# Patient Record
Sex: Female | Born: 1965 | Race: Black or African American | Hispanic: No | Smoking: Never smoker
Health system: Southern US, Community
[De-identification: ages and names within clinical notes are randomized; demographics above are authoritative.]

## PROBLEM LIST (undated history)

## (undated) DIAGNOSIS — F329 Major depressive disorder, single episode, unspecified: Secondary | ICD-10-CM

## (undated) DIAGNOSIS — F419 Anxiety disorder, unspecified: Secondary | ICD-10-CM

## (undated) DIAGNOSIS — F32A Depression, unspecified: Secondary | ICD-10-CM

## (undated) HISTORY — PX: BREAST BIOPSY: SHX20

## (undated) HISTORY — PX: ABDOMINAL HYSTERECTOMY: SHX81

---

## 1998-12-08 ENCOUNTER — Other Ambulatory Visit: Admission: RE | Admit: 1998-12-08 | Discharge: 1998-12-08 | Payer: Self-pay | Admitting: Obstetrics & Gynecology

## 1999-06-16 ENCOUNTER — Inpatient Hospital Stay (HOSPITAL_COMMUNITY): Admission: AD | Admit: 1999-06-16 | Discharge: 1999-06-18 | Payer: Self-pay | Admitting: Obstetrics and Gynecology

## 1999-06-16 ENCOUNTER — Encounter (INDEPENDENT_AMBULATORY_CARE_PROVIDER_SITE_OTHER): Payer: Self-pay | Admitting: *Deleted

## 1999-11-12 ENCOUNTER — Other Ambulatory Visit: Admission: RE | Admit: 1999-11-12 | Discharge: 1999-11-12 | Payer: Self-pay | Admitting: Obstetrics and Gynecology

## 2002-04-21 ENCOUNTER — Other Ambulatory Visit: Admission: RE | Admit: 2002-04-21 | Discharge: 2002-04-21 | Payer: Self-pay | Admitting: Obstetrics and Gynecology

## 2002-06-11 ENCOUNTER — Encounter: Payer: Self-pay | Admitting: Family Medicine

## 2002-06-11 ENCOUNTER — Ambulatory Visit (HOSPITAL_COMMUNITY): Admission: RE | Admit: 2002-06-11 | Discharge: 2002-06-11 | Payer: Self-pay | Admitting: Family Medicine

## 2002-11-25 ENCOUNTER — Ambulatory Visit (HOSPITAL_COMMUNITY): Admission: RE | Admit: 2002-11-25 | Discharge: 2002-11-25 | Payer: Self-pay | Admitting: Obstetrics and Gynecology

## 2002-11-25 ENCOUNTER — Encounter: Payer: Self-pay | Admitting: Obstetrics and Gynecology

## 2003-05-11 ENCOUNTER — Other Ambulatory Visit: Admission: RE | Admit: 2003-05-11 | Discharge: 2003-05-11 | Payer: Self-pay | Admitting: Obstetrics and Gynecology

## 2005-01-15 ENCOUNTER — Other Ambulatory Visit: Admission: RE | Admit: 2005-01-15 | Discharge: 2005-01-15 | Payer: Self-pay | Admitting: Obstetrics and Gynecology

## 2005-01-18 ENCOUNTER — Encounter: Admission: RE | Admit: 2005-01-18 | Discharge: 2005-01-18 | Payer: Self-pay | Admitting: Obstetrics and Gynecology

## 2005-04-24 ENCOUNTER — Ambulatory Visit: Admission: RE | Admit: 2005-04-24 | Discharge: 2005-04-24 | Payer: Self-pay | Admitting: Gynecology

## 2005-05-24 ENCOUNTER — Observation Stay (HOSPITAL_COMMUNITY): Admission: RE | Admit: 2005-05-24 | Discharge: 2005-05-25 | Payer: Self-pay | Admitting: Obstetrics and Gynecology

## 2005-05-24 ENCOUNTER — Encounter (INDEPENDENT_AMBULATORY_CARE_PROVIDER_SITE_OTHER): Payer: Self-pay | Admitting: Specialist

## 2005-06-03 ENCOUNTER — Inpatient Hospital Stay (HOSPITAL_COMMUNITY): Admission: AD | Admit: 2005-06-03 | Discharge: 2005-06-06 | Payer: Self-pay | Admitting: Obstetrics and Gynecology

## 2006-09-22 ENCOUNTER — Ambulatory Visit (HOSPITAL_COMMUNITY): Admission: RE | Admit: 2006-09-22 | Discharge: 2006-09-22 | Payer: Self-pay | Admitting: Family Medicine

## 2006-10-07 ENCOUNTER — Encounter: Admission: RE | Admit: 2006-10-07 | Discharge: 2006-10-07 | Payer: Self-pay | Admitting: Family Medicine

## 2009-11-10 ENCOUNTER — Ambulatory Visit (HOSPITAL_COMMUNITY): Admission: RE | Admit: 2009-11-10 | Discharge: 2009-11-10 | Payer: Self-pay | Admitting: Family Medicine

## 2010-09-30 ENCOUNTER — Encounter: Payer: Self-pay | Admitting: Family Medicine

## 2011-01-25 NOTE — Discharge Summary (Signed)
NAMEILINE, BUCHINGER            ACCOUNT NO.:  [id number]   MEDICAL RECORD NO.:  [id number]          PATIENT TYPE:  INP   LOCATION:  9310                          [facility]ILITY[facility]   PHYSICIAN:  Naima A. Dillard, M.D. DATE OF BIRTH:  January 02, 1966   DATE OF ADMISSION:  06/03/2005  DATE OF DISCHARGE:  06/06/2005                                 DISCHARGE SUMMARY   DISCHARGE DIAGNOSIS:  1.  Fever.  2.  Status post laparoscopically assisted vaginal hysterectomy with lysis of      adhesions.  3.  History of atypical endometrial hyperplasia.   PROCEDURES:  On hospital day #1 the patient underwent a CT scan of the  abdomen and pelvis which revealed a 9.6 cm pelvic collection.   HISTORY OF PRESENT ILLNESS:  Deanna Whitaker is a 45 year old married African-  American female para 2-0-0-2 who was status post laparoscopic assisted  vaginal hysterectomy with lysis of adhesions (05/24/2005) who presented to  maternity admissions unit at [facility] complaining of a fever of  101.5. The patient was admitted to rule out cuff cellulitis versus urinary  tract infection. Please see the patient's dictated history and physical  examination for details.   ADMISSION PHYSICAL EXAM:  VITAL SIGNS:  Temperature 100.4 degrees  Fahrenheit, blood pressure 101/61, pulse 97, respirations 20.  GENERAL:  General exam was within normal limits except for back revealed  mild left CVA tenderness.  ABDOMEN:  Soft with positive bowel sounds in all four quadrants, however,  there was mild diffuse tenderness noted but no rebound or guarding.  PELVIC:  The patient's pelvic exam was deferred.   HOSPITAL COURSE:  On the day of admission the patient was placed on IV  antibiotics and routine laboratory tests obtained. The patient's white blood  count was noted to be 24.5 with a left shift and her liver enzymes were  elevated. Hospital day #1 the patient underwent a CT scan of abdomen and  pelvis which revealed a fluid  collection in the pelvis measuring 9.6 cm.  This decision was made to observe this collection along with the patient's  response to antibiotics rather than draining this collection. The patient's  fever quickly defervesced by hospital day #3 and she remained afebrile for  the remainder of her hospital stay. The patient's liver enzymes decreased  significantly however, they were still slightly elevated by the time of the  patient's discharge. Plans were to recheck the patient's CBC and her liver  function tests at her postoperative visit. The patient's hepatitis panel was  negative. By hospital day#4 the patient had achieved maximum benefit of her  hospital stay and was therefore deemed ready for discharge home.   DISCHARGE MEDICATIONS:  1.  Motrin 800 mg 1 tablet with food every 8 hours as needed for pain.  2.  Augmentin 1 tablet daily for 8 days.  3.  Vicodin 1-2 tablets every 4-6 hours as needed for pain.  4.  Terazol 3 to apply to affected area at bedtime for 3 days as needed for      yeast.   FOLLOW UP:  The patient is  scheduled to follow up with Naima A. Dillard,  M.D. as previously scheduled for her postoperative visit.   DISCHARGE INSTRUCTIONS:  She was advised to call for temperature greater  than 100.0, to continue current regimen, to take her temperature daily, to  avoid lifting for 4 weeks, sexual activity for 6 weeks. The patient was  advised that she may shower. Her diet was without restriction.      Elmira J. Adline Peals.      Naima A. Normand Sloop, M.D.  Electronically Signed    EJP/MEDQ  D:  07/03/2005  T:  07/03/2005  Job:  [id number]

## 2011-01-25 NOTE — H&P (Signed)
Deanna Whitaker, Deanna Whitaker            ACCOUNT NO.:  [id number]   MEDICAL RECORD NO.:  [id number]          PATIENT TYPE:  AMB   LOCATION:  SDC                           [facility]   PHYSICIAN:  Naima A. Dillard, M.D. DATE OF BIRTH:  June 11, 1966   DATE OF ADMISSION:  DATE OF DISCHARGE:                                HISTORY & PHYSICAL   CHIEF COMPLAINT:  Uterine complex hyperplasia with focal atypia.  The  patient desires definitive treatment.  The patient is a 45 year old African-  American female, gravida 2, para 2, who presented with irregular bleeding  and a history of polycystic ovarian syndrome.  She had an endometrial biopsy  on March 01, 2005, which was significant for complex hyperplasia with focal  atypia.  The patient was given the option of medical versus surgical  treatment and the patient has decided to proceed with hysterectomy.  The  patient also had a consultation with De Blanch, M.D. in  GYN/Oncology and he also felt that a hysterectomy would be the standard of  care and the patient decided to go with a hysterectomy.   PAST MEDICAL HISTORY:  Significant for polycystic ovarian syndrome.   PAST SURGICAL HISTORY:  Significant for bilateral tubal ligation.   MEDICATIONS:  Currently on no medications.   ALLERGIES:  No known drug allergies.   SOCIAL HISTORY:  Negative for alcohol, tobacco, and drug abuse.   FAMILY HISTORY:  Significant for colon cancer and no history of ovarian or  breast cancer.   REVIEW OF SYSTEMS:  ENDOCRINE:  Significant for polycystic ovarian syndrome.  PSYCHIATRIC:  Unremarkable.  MUSCULOSKELETAL:  Unremarkable.  CARDIOVASCULAR:  Unremarkable.  RESPIRATORY:  Unremarkable.  GENITOURINARY:  As above.   PHYSICAL EXAMINATION:  VITAL SIGNS:  Blood pressure 110/80, the patient  weighs 224 pounds.  HEENT:  Pupils are equal, hearing is normal.  Throat is clear.  Thyroid is  not enlarged.  HEART:  Regular rate and rhythm.  LUNGS:  Clear  to auscultation bilaterally.  BREASTS:  No masses, discharge, skin changes, or nipple retraction.  BACK:  No CVA tenderness.  ABDOMEN:  Nontender without any masses or organomegaly.  EXTREMITIES:  No cyanosis, clubbing, or edema.  NEUROLOGY:  Within normal limits.  PELVIC:  Vulva and vaginal examination is within normal limits.  Cervix is  nontender without any lesions.  Uterus is top normal size, nontender, and  there are no adnexal masses.  labs  Endometrial biopsy is significant for complex hyperplasia with focal atypia.  Ultrasound is significant for a uterus of 10.5 x 5.19 x 7.1.  Pap smear was  negative for intraepithelial lesions or malignancy. Assesment and Plan.  Pt  has atypical uterine hyperplasia.  Plan is LAVH. The patient understands the  risks are, but not limited to, nausea, vomiting, bleeding, pain, infection,  poor healing, problems with anesthesia, hernia formation, and adhesions,  injury  to bowel, bladder, fallopian tubes, ovaries, and ureter, nerves going from  the pelvis to the legs also may be injured.  The patient agrees to proceed  with the procedure.  We will leave her ovaries in place.  She understands  that there is a small chance that uterine cancer could be present.      Naima A. Normand Sloop, M.D.  Electronically Signed     NAD/MEDQ  D:  05/23/2005  T:  05/23/2005  Job:  [id number]

## 2011-01-25 NOTE — H&P (Signed)
Deanna, Whitaker            ACCOUNT NO.:  [id number]   MEDICAL RECORD NO.:  [id number]          PATIENT TYPE:  INP   LOCATION:  9310                          [facility]   PHYSICIAN:  Hal Morales, M.D.DATE OF BIRTH:  1966-06-15   DATE OF ADMISSION:  06/03/2005  DATE OF DISCHARGE:                                HISTORY & PHYSICAL   HISTORY OF PRESENT ILLNESS:  Ms. Deanna Whitaker is a 45 year old married African-  American female gravida 2, para 2, who presents to [facility] this evening with  complaints of temperature to 101.5 and 101.3 degrees this afternoon and  evening.  The patient's history is significant in the fact that she is  status post laparoscopic-assisted vaginal hysterectomy with lysis of  adhesions on May 24, 2005 due to complex hyperplasia with atypia.  The  patient also reports that she has had increased midline lower abdominal  pressure and pain as well as urinary frequency and hesitancy and slight  dysuria as well over the past three days.  The patient also reports  recurrence of nausea as well today, however, no vomiting.  The patient  reports that she had been doing well postoperatively until Saturday.  The  patient reports that she has been having regular bowel movements with her  last bowel movement being June 02, 2005, however, none today but she  has had some flatus.  The patient reports the lower abdominal pain has been  responsive to Vicodin and ibuprofen.  The patient denies any vaginal  bleeding.  The patient denies any shortness of breath or chest pain.  The  patient denies any cough, denies any redness or drainage of her laparoscopic  incision.  The patient denies any extremity pain.  The patient also states  that she did have chills this afternoon.  Patient denies any definite flank  pain at the present time, however, she has had some back pain.  The patient  states she took her last Vicodin and ibuprofen at 7:30 P.M. today prior to  admission at [facility].   PAST MEDICAL HISTORY:  Significant for polycystic ovarian syndrome.   PAST SURGICAL HISTORY:  Bilateral tubal ligation, laparoscopically assisted  vaginal hysterectomy with lysis of adhesions May 24, 2005.   MEDICATIONS:  1.  Vicodin one to two tablets q.4-6h as needed for pain.  2.  Ibuprofen 600 mg every 6 hours as needed for pain.  3.  Nausea medicine, unsure of the name as needed for nausea, otherwise no      other medications.   ALLERGIES:  No known drug allergies.   SOCIAL HISTORY:  Patient denies alcohol, tobacco and drug abuse.   FAMILY HISTORY:  Significant for colon cancer and no history of ovarian or  breast cancer.   REVIEW OF SYSTEMS:  ENDOCRINE:  Significant for polycystic ovarian syndrome.  PSYCH:  Within normal limits.  MUSCULOSKELETAL:  Unremarkable.  CARDIOVASCULAR:  Unremarkable.  RESPIRATORY:  Unremarkable.  GENITOURINARY:  As above.   PHYSICAL EXAMINATION:  VITAL SIGNS:  Temperature 100.4 degrees, blood  pressure 101/61, pulse 97, respirations 20.  SKIN:  Warm and dry.  Color is  satisfactory.  GENERAL APPEARANCE:  Patient in no apparent distress.  HEENT:  Pupils are equal. Hearing is normal.  Throat is clear.  Thyroid is  not enlarged.  HEART:  Regular rate and rhythm.  LUNGS:  Clear to auscultation bilaterally.  BREASTS:  No masses, discharge, skin changes or nipple retraction.  BACK:  Very mild left costovertebral angle tenderness is noted to be  present.  ABDOMEN:  Soft, with positive bowel sounds in all four quadrants with mild  diffuse tenderness noted.  No rebound or guarding is present.  Infraumbilical incision noted to be well healed with no redness or drainage  present.  EXTREMITIES:  No cyanosis, clubbing or edema and Homan's sign is negative  bilaterally.  NEUROLOGICAL:  Within normal limits.  PELVIC:  Examination is deferred.   LABORATORY DATA:  Admission CBC reveals white blood cell count 20.9,  hemoglobin 12.9,  hematocrit 38.6, platelet count 372,000.  Urinalysis  specific gravity less than 1.005, moderate amount of hemoglobin, moderate  amount of leukocyte esterase, negative nitrites noted.  Metabolic panel:  Glucose 131, non fasting, BUN 3, SGOT 59, SGPT 111, alkaline phosphatase  137, otherwise within normal limits.   ASSESSMENT:  1.  Fever.  2.  10 days postoperative laparoscopically assisted vaginal hysterectomy      with lysis of adhesions.  3.  History of complex endometrial hyperplasia with atypia.  4.  Urinary tract infection versus vaginal cuff cellulitis.   PLAN:  Patient will be admitted to GYN per Dr. Pennie Rushing.  The patient will be  started on antibiotics and intravenous fluids and will be observed for  fevers.  Orders per Dr. Pennie Rushing and the patient will be followed by the M.D.  service.     ______________________________  Rhona Leavens, CNM      Hal Morales, M.D.  Electronically Signed    NOS/MEDQ  D:  06/04/2005  T:  06/04/2005  Job:  [id number]

## 2011-01-25 NOTE — Discharge Summary (Signed)
NAMEHONOUR, SCHWIEGER            ACCOUNT NO.:  [id number]   MEDICAL RECORD NO.:  [id number]          PATIENT TYPE:  OBV   LOCATION:  9311                          [facility]   PHYSICIAN:  Naima A. Dillard, M.D. DATE OF BIRTH:  April 30, 1966   DATE OF ADMISSION:  05/24/2005  DATE OF DISCHARGE:  05/25/2005                                 DISCHARGE SUMMARY   DISCHARGE DIAGNOSIS:  1.  Complex atypical endometrial hyperplasia  2.  Uterine fibroids  3.  Pelvic adhesions  4.  Polycystic ovarian syndrome.   OPERATION:  On the day of admission the patient underwent a laparoscopically-  assisted vaginal hysterectomy with lysis of adhesions, tolerated procedure  well. The patient was found to have an upper limits of normal size fibroid  uterus with a normal-appearing right tube and ovary and an adhered left tube  and ovary. 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   HISTORY OF PRESENT ILLNESS:  Ms. Deanna Whitaker is a 45 year old African-  American female para 2-0-0-2 who has a history of polycystic ovarian  syndrome who presents for hysterectomy because of irregular vaginal bleeding  and complex atypical endometrial hyperplasia. Please see the patient's  dictated history and physical examination for details.   PREOPERATIVE PHYSICAL EXAM:  Blood pressure 110/80, weight is 224 pounds.  General exam was within normal limits. Pelvic examination, vulva, and  vaginal exam was within normal limits. Cervix is nontender without any  lesions. Uterus was upper limits of normal size and there were no adnexal  masses.   HOSPITAL COURSE:  On the day of admission the patient underwent  aforementioned procedures tolerating them all well. The patient's postop  hemoglobin was 12.7 (preoperative hemoglobin 14.7). By postop day #1 the  patient had resumed bowel and bladder function and was therefore deemed  ready for discharge home.   DISCHARGE MEDICATIONS:  1.  Motrin 800  milligrams every 8 hours with food as needed for pain  2.  Vicodin 1-2 tablets every 4-6 hours as needed for pain  3.  Colace 1 tablet twice daily as needed  4.  Phenergan 25 milligrams every 6 hours as needed.   FOLLOW UP:  The patient is scheduled for a 6 weeks postoperative visit with  Dr. Normand Sloop on July 05, 2005 at 3:15 p.m.   DISCHARGE INSTRUCTIONS:  The patient was given a copy of [facility] postoperative instruction sheet. She was further advised to avoid  driving for 2 weeks, heavy lifting for 4 weeks, intercourse for 6 weeks,  that she should increase her activities slowly, and that she may shower. The  patient was further encouraged to keep her incision clean and dry. The  patient's diet was without restriction.   FINAL PATHOLOGY:  Left adnexal biopsy: Portions of dilated fallopian tube  and benign fibrous connective tissue with hemorrhage. The  uterus/hysterectomy: Uterine cervix with benign ectocervical and  endocervical mucosa; uterine corpus with proliferative endometrium and small  endometrial polyp with complex atypical endometrial hyperplasia; no invasive  carcinoma identified, intramural and subserosal leiomyomata.      Elmira J. Powell, P.A.  Naima A. Normand Sloop, M.D.  Electronically Signed    EJP/MEDQ  D:  07/04/2005  T:  07/04/2005  Job:  [id number]

## 2011-01-25 NOTE — Consult Note (Signed)
NAMELIZET, KELSO            ACCOUNT NO.:  [id number]   MEDICAL RECORD NO.:  [id number]          PATIENT TYPE:  AMB   LOCATION:  SDC                           [facility]   PHYSICIAN:  De Blanch, M.D.DATE OF BIRTH:  1966/06/30   DATE OF CONSULTATION:  04/24/2005  DATE OF DISCHARGE:                                   CONSULTATION   CHIEF COMPLAINT:  Complex endometrial hyperplasia with focal atypia.   HISTORY OF PRESENT ILLNESS:  A 45 year old African-American female, gravida  2 seen in consultation at request of Dr. Jaymes Graff regarding management  of complex hyperplasia with focal atypia.   The patient had irregular heavy bleeding over the past several months and  underwent an endometrial biopsy on March 01, 2005, which revealed complex  hyperplasia with focal atypia.   The patient has known polycystic ovarian disease but no other significant  gynecologic history.  She denies any pelvic pain, pressure or any other GI  or GU symptoms.   PAST MEDICAL HISTORY:  Polycystic ovarian disease.   PAST SURGICAL HISTORY:  Bilateral tubal ligation.   DRUG ALLERGIES:  None.   CURRENT MEDICATIONS:  None.   FAMILY HISTORY:  Negative gynecologic, breast or colon cancer.   OBSTETRICAL HISTORY:  Gravida 31 with a 19 year old on a 71-year-old.   SOCIAL HISTORY:  The patient is a Furniture conservator/restorer in industry.  She does not smoke.   REVIEW OF SYSTEMS:  A 10-point comprehensive review of systems performed and  is negative except for symptoms noted above.   PHYSICAL EXAMINATION:  VITAL SIGNS:  Height 5 feet 3 inches, weight 220,  blood pressure 112/80, pulse 96. GENERAL:  The patient is a healthy obese  African-American female in no acute distress. HEENT is negative.  NECK:  Supple without thyromegaly.  LYMPHATIC:  There is no supraclavicular or inguinal adenopathy.  ABDOMEN:  The abdomen is obese, soft, nontender.  No mass, organomegaly, or  hiatal hernias are  noted.  PELVIC:  EGBUS, vagina, bladder and urethra are normal.  The cervix is  normal.  The uterus is anterior, normal size, shape and consistency.  There  are no adnexal masses noted.  Rectovaginal exam confirms.  EXTREMITIES:  Lower extremity without edema or varicosities.   IMPRESSION:  Complex endometrial hyperplasia with atypia in a patient with  known polycystic ovarian disease.   PLAN:  I had a lengthy discussion with the patient regarding the natural  history of this disease and its association with endometrial cancer.  I  indicated that the standard would be to perform a hysterectomy.  Given her  age, I do not believe the ovaries need to be removed. She understands there  is some risk of having coexisting endometrial cancer.  The patient is  desirous of proceeding with definitive surgery and she will return to the  care of Dr. Normand Sloop to schedule that surgery at their mutual convenience.  The risks of surgery including hemorrhage, infection, injury to adjacent  viscera, thrombolic complications, anesthetic risks were outlined to the  patient.      De Blanch, M.D.  Electronically Signed  DC/MEDQ  D:  04/24/2005  T:  04/25/2005  Job:  [id number]   cc:   Naima A. Normand Sloop, M.D.  Fax: [phone]   Telford Nab, R.N.  [address]  [place], [place] [address]

## 2011-01-25 NOTE — Op Note (Signed)
Deanna Whitaker, Deanna Whitaker            ACCOUNT NO.:  [id number]   MEDICAL RECORD NO.:  [id number]          PATIENT TYPE:  OBV   LOCATION:  9399                          [facility]   PHYSICIAN:  Naima A. Dillard, M.D. DATE OF BIRTH:  31-Dec-1965   DATE OF PROCEDURE:  05/24/2005  DATE OF DISCHARGE:                                 OPERATIVE REPORT   PREOPERATIVE DIAGNOSIS:  Complex hyperplasia with atypia.   POSTOPERATIVE DIAGNOSIS:  Complex hyperplasia with atypia, with pelvic  adhesions and uterine fibroids.   PROCEDURE:  Laparoscopically-assisted vaginal hysterectomy and lysis of  adhesions.   SURGEON:  Naima A. Normand Sloop, M.D.   ASSISTANTMarquis Lunch. Adline Peals.   ANESTHESIA:  General endotracheal tube.   SPECIMENS:  Uterus and cervix.   ESTIMATED BLOOD LOSS:  150 mL.   URINE OUTPUT:  450 mL clear urine.   IV FLUIDS:  2100 mL crystalloid.   COMPLICATIONS:  None.   DISPOSITION:  The patient went to the PACU in stable condition.   DESCRIPTION OF PROCEDURE:  The patient was taken to the operating room where  she was placed in the dorsal lithotomy position, prepped and draped in the  usual sterile fashion, and given general anesthesia.  A bivalve speculum was  placed into the vagina.  The anterior lip of the cervix was grasped with a  single tooth tenaculum.  A Hulka manipulator was placed into the uterine  cavity and a single tooth tenaculum and bivalve speculum were removed.  Attention was then turned to the umbilicus where 5 mL of 0.25% Marcaine was  placed in the umbilicus incision site and a horizontal 10 mm infraumbilical  incision was made with a scalpel and carried down to the fascia.  The fascia  was incised in the midline and extended bilaterally.  The peritoneum was  identified, tented up, and entered sharply.  An 0 Vicryl was used to  pursestring the fascia.  The Hasson was placed into the intra-abdominal  cavity.  The abdomen was insufflated with CO2 gas.  The  patient was noted to  have some liver adhesions.  This was consistent with Fitz-Hugh-Curtis  syndrome.  We were unable to see her gallbladder.  Bowels appeared normal.  The uterus was boggy with a uterine fibroid at the fundus.  There were some  adhesions and a paratubal cyst on the left, left adnexa, and left ovary.  Right tube and ovary appeared normal.  Two 5 mm infraumbilical incisions  were made in the right and left lower quadrant.  2.5 mL of 0.25% Marcaine  was placed in each quadrant and two 5 mm incisions were made with the  scalpel.  Two 5 mm trocars were placed with visualization of the  laparoscope.  Attention was then turned to the patient's left tube where  adhesions were lysed sharply with Metzenbaum scissors.  The patient's round  ligaments were both cauterized and excised.  The peritoneum was dissected  using irrigation with the Nezhat and the vesicouterine peritoneum and  bladder flap was made using blunt dissection and Metzenbaum scissors.  Attention was then turned to  the left utero-ovarian ligament which was  cauterized and cut.  The patient's utero-ovarian ligament was cauterized and  cut in a similar fashion.  Attention was turned to the vagina where two  single tooth tenaculi were placed on the cervix.  The Hulka manipulator was  removed.  40 mL of Pitressin 20 units and 100 mL were used to infiltrate the  cervix.  The cervix was then circumferentially incised with the knife.  The  cervical vaginal mucosa was then bluntly removed.  The posterior fornix was  then entered sharply.  The uterosacral ligaments were clamped with Heaney  clamps, cut, suture ligated, and held.  The anterior cul-de-sac was then  entered sharply.  The cardinal ligaments were then clamped, cut, and ligated  with 0 Vicryl.  Hemostasis was assured bilaterally.  The uterine arteries  were then clamped, cut, and ligated.  Hemostasis was assured.  The uterus  was then inverted and removed without  difficulty.  Hemostasis was assured.  The McCall suture was then placed.  The vaginal cuff was closed with 0  chromic in a running locked fashion.  Hemostasis was assured.  The McCall  suture was then retied.  Attention was then turned back up to the abdomen.  The pelvis was irrigated copiously.  Both utero-ovarian ligaments were  hemostatic.  The cuff was also noted to be hemostatic.  The pelvis was then  irrigated copiously.  Each trocar was removed under direct visualization of  the laparoscope.  The Hasson was removed under direct visualization with the  laparoscope.  All air was allowed to leave the abdomen.  The fascia was  reapproximated by tying the pursestring suture.  The skin incisions were  closed with 3-0 Monocryl.  The vagina was packed with 2-inch packing with  Premarin vaginal cream.  Needle, sponge, and instrument counts correct x2.  The patient went to the recovery room in stable condition.      Naima A. Normand Sloop, M.D.  Electronically Signed     NAD/MEDQ  D:  05/24/2005  T:  05/24/2005  Job:  [id number]

## 2011-01-28 ENCOUNTER — Other Ambulatory Visit: Payer: Self-pay | Admitting: Family Medicine

## 2011-01-28 DIAGNOSIS — N6310 Unspecified lump in the right breast, unspecified quadrant: Secondary | ICD-10-CM

## 2011-02-13 ENCOUNTER — Ambulatory Visit (HOSPITAL_COMMUNITY)
Admission: RE | Admit: 2011-02-13 | Discharge: 2011-02-13 | Disposition: A | Discharge status: Home / Self Care | Payer: 59 | Source: Ambulatory Visit | Attending: Family Medicine | Admitting: Family Medicine

## 2011-02-13 ENCOUNTER — Other Ambulatory Visit: Payer: Self-pay | Admitting: Diagnostic Radiology

## 2011-02-13 ENCOUNTER — Other Ambulatory Visit: Payer: Self-pay | Admitting: Family Medicine

## 2011-02-13 DIAGNOSIS — N6310 Unspecified lump in the right breast, unspecified quadrant: Secondary | ICD-10-CM

## 2011-02-13 DIAGNOSIS — N63 Unspecified lump in unspecified breast: Secondary | ICD-10-CM | POA: Insufficient documentation

## 2011-02-13 DIAGNOSIS — N61 Mastitis without abscess: Secondary | ICD-10-CM | POA: Insufficient documentation

## 2011-02-13 DIAGNOSIS — N644 Mastodynia: Secondary | ICD-10-CM | POA: Insufficient documentation

## 2012-11-23 ENCOUNTER — Emergency Department (HOSPITAL_COMMUNITY)
Admission: EM | Admit: 2012-11-23 | Discharge: 2012-11-23 | Disposition: A | Discharge status: Home / Self Care | Payer: 59 | Attending: Emergency Medicine | Admitting: Emergency Medicine

## 2012-11-23 ENCOUNTER — Encounter (HOSPITAL_COMMUNITY): Payer: Self-pay | Admitting: *Deleted

## 2012-11-23 DIAGNOSIS — R42 Dizziness and giddiness: Secondary | ICD-10-CM | POA: Insufficient documentation

## 2012-11-23 DIAGNOSIS — J029 Acute pharyngitis, unspecified: Secondary | ICD-10-CM | POA: Insufficient documentation

## 2012-11-23 LAB — RAPID STREP SCREEN (MED CTR MEBANE ONLY): Streptococcus, Group A Screen (Direct): NEGATIVE

## 2012-11-23 NOTE — ED Notes (Addendum)
Pt reporting sore throat began yesterday and has progressively gotten worse.  Also reporting occasional light headedness and body aches. Severe pain with swallowing.  No relief from Nyquil or chloraseptic spray.

## 2012-11-23 NOTE — ED Provider Notes (Signed)
History     CSN: [id number]  Arrival date & time 11/23/12  4098   First MD Initiated Contact with Patient 11/23/12 0401      Chief Complaint  Patient presents with  . Sore Throat  . Dizziness    (Consider location/radiation/quality/duration/timing/severity/associated sxs/prior treatment) HPI Deanna Whitaker is a 47 y.o. female who presents to the Emergency Department complaining of sore throat that began yesterday morning. She has been taking dayquil and chloraseptic without relief. History reviewed. No pertinent past medical history.  Past Surgical History  Procedure Laterality Date  . Abdominal hysterectomy      History reviewed. No pertinent family history.  History  Substance Use Topics  . Smoking status: Never Smoker   . Smokeless tobacco: Not on file  . Alcohol Use: No    OB History   Grav Para Term Preterm Abortions TAB SAB Ect Mult Living                  Review of Systems  Constitutional: Negative for fever.       10 Systems reviewed and are negative for acute change except as noted in the HPI.  HENT: Positive for sore throat. Negative for congestion.   Eyes: Negative for discharge and redness.  Respiratory: Negative for cough and shortness of breath.   Cardiovascular: Negative for chest pain.  Gastrointestinal: Negative for vomiting and abdominal pain.  Musculoskeletal: Negative for back pain.  Skin: Negative for rash.  Neurological: Negative for syncope, numbness and headaches.  Psychiatric/Behavioral:       No behavior change.    Allergies  Review of patient's allergies indicates no known allergies.  Home Medications  No current outpatient prescriptions on file.  BP 146/92  Pulse 93  Temp(Src) 99.9 F (37.7 C) (Oral)  Resp 18  Ht 5\' 3"  (1.6 m)  Wt 225 lb (102.059 kg)  BMI 39.87 kg/m2  SpO2 95%  Physical Exam  Nursing note and vitals reviewed. Constitutional: She appears well-developed and well-nourished.  Awake, alert, nontoxic  appearance.  HENT:  Head: Normocephalic and atraumatic.  Right Ear: External ear normal.  Left Ear: External ear normal.  Nose: Nose normal.  Mouth/Throat: Oropharynx is clear and moist.  Eyes: EOM are normal. Pupils are equal, round, and reactive to light. Right eye exhibits no discharge. Left eye exhibits no discharge.  Neck: Normal range of motion. Neck supple.  Cardiovascular: Normal rate and intact distal pulses.   Pulmonary/Chest: Effort normal and breath sounds normal. She exhibits no tenderness.  Abdominal: Soft. Bowel sounds are normal. There is no tenderness. There is no rebound.  Musculoskeletal: She exhibits no tenderness.  Baseline ROM, no obvious new focal weakness.  Neurological:  Mental status and motor strength appears baseline for patient and situation.  Skin: No rash noted.  Psychiatric: She has a normal mood and affect.    ED Course  Procedures (including critical care time) Results for orders placed during the hospital encounter of 11/23/12  RAPID STREP SCREEN      Result Value Range   Streptococcus, Group A Screen (Direct) NEGATIVE  NEGATIVE       MDM  Patient with sore throat that has not responded to chloraseptic or dayquil. Strep screen is negative. Advised patient on how to treat sore throat.Pt stable in ED with no significant deterioration in condition.The patient appears reasonably screened and/or stabilized for discharge and I doubt any other medical condition or other [facility] requiring further screening, evaluation, or treatment in the ED  at this time prior to discharge.  MDM Reviewed: nursing note and vitals Interpretation: labs           Nicoletta Dress. Colon Branch, MD 11/23/12 [phone]

## 2014-02-07 ENCOUNTER — Encounter: Payer: Self-pay | Admitting: Family Medicine

## 2014-02-07 ENCOUNTER — Ambulatory Visit (INDEPENDENT_AMBULATORY_CARE_PROVIDER_SITE_OTHER): Payer: 59 | Admitting: Family Medicine

## 2014-02-07 VITALS — BP 138/86 | Ht 63.5 in | Wt 228.2 lb

## 2014-02-07 DIAGNOSIS — R7301 Impaired fasting glucose: Secondary | ICD-10-CM | POA: Insufficient documentation

## 2014-02-07 DIAGNOSIS — M1712 Unilateral primary osteoarthritis, left knee: Secondary | ICD-10-CM | POA: Insufficient documentation

## 2014-02-07 DIAGNOSIS — Z803 Family history of malignant neoplasm of breast: Secondary | ICD-10-CM

## 2014-02-07 DIAGNOSIS — M1710 Unilateral primary osteoarthritis, unspecified knee: Secondary | ICD-10-CM

## 2014-02-07 DIAGNOSIS — E669 Obesity, unspecified: Secondary | ICD-10-CM | POA: Insufficient documentation

## 2014-02-07 DIAGNOSIS — IMO0002 Reserved for concepts with insufficient information to code with codable children: Secondary | ICD-10-CM

## 2014-02-07 NOTE — Patient Instructions (Signed)
Try to exercise and lose weight  Use two aleave tabs twice per day with food when this pain flares

## 2014-02-07 NOTE — Progress Notes (Signed)
   Subjective:    Patient ID: Deanna Whitaker, female    DOB: 06/22/1966, 48 y.o.   MRN: [id number]  HPI Patient arrives with knot on back of neck for about a month.at first felt stiffness and discomfort in the neck. Felt aswelling and a knot for a month or so  Leg has been acting up off and on, having discomfort  Swollen painful, trouble walking  Women's ck ups not for awhile,   Patient also concerned about inflammation or swelling in left leg. Sr. recently diagnosed with metastatic breast cancer. Hospice has been called.  Patient has history of her breast biopsy which was negative.  Recalls no injury to the knees.  Has noted this on the neck.  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  Had blood work screening at work which revealed elevated sugar.  Review of Systems No headache no chest pain no back pain gradual weight gain no bowel pain no change in bowel habits    Objective:   Physical Exam  Alert no acute distress. Vitals reviewed. Nuchal fat pad noted. Lungs clear heart regular in rhythm. Crepitation knees left greater than right. No effusion. Mild medial knee swelling.      Assessment & Plan:  Impression 1 glucose intolerance discussed very important. #2 fat pad of the neck associated with chronic cervical strain. Local measures discussed. Presence of this reveals increased risk of diabetes etc. in the future. Discussed with patient. #3 knee arthritis progressive discussed. #4 family history of breast cancer with some noncompliance on the part of the patient for mammograms plan mammogram strongly encourage. Diet exercise discussed. Aleve when necessary for knee pain. WSL

## 2014-07-12 ENCOUNTER — Ambulatory Visit (INDEPENDENT_AMBULATORY_CARE_PROVIDER_SITE_OTHER): Payer: 59 | Admitting: Family Medicine

## 2014-07-12 ENCOUNTER — Encounter: Payer: Self-pay | Admitting: Family Medicine

## 2014-07-12 VITALS — BP 148/98 | Ht 63.5 in | Wt 230.0 lb

## 2014-07-12 DIAGNOSIS — R0789 Other chest pain: Secondary | ICD-10-CM

## 2014-07-12 DIAGNOSIS — F329 Major depressive disorder, single episode, unspecified: Secondary | ICD-10-CM

## 2014-07-12 DIAGNOSIS — F32A Depression, unspecified: Secondary | ICD-10-CM

## 2014-07-12 DIAGNOSIS — G4700 Insomnia, unspecified: Secondary | ICD-10-CM

## 2014-07-12 MED ORDER — CLONAZEPAM 1 MG PO TABS: ORAL_TABLET | ORAL | Status: DC

## 2014-07-12 MED ORDER — ESCITALOPRAM OXALATE 10 MG PO TABS: 10.0000 mg | ORAL_TABLET | Freq: Every day | ORAL | Status: DC

## 2014-07-12 NOTE — Progress Notes (Signed)
   Subjective:    Patient ID: Deanna RibasWanda L Whitaker, female    DOB: 03/16/1966, 48 y.o.   MRN: [id number]  Anxiety Presents for initial visit. Onset was 1 to 6 months ago (March). The problem has been gradually worsening. Symptoms include chest pain, irritability, muscle tension, restlessness and shortness of breath. Symptoms occur most days. The severity of symptoms is causing significant distress. The symptoms are aggravated by work stress. The patient sleeps 5 hours per night. The quality of sleep is poor. Nighttime awakenings: none.   There are no known risk factors. Treatments tried: [employer].   Trying to deal with it on her own  Work has become very difficult  Feeling stressed out  Feeling headaches and queaziness in the stomachjust at the thought of going back to work. Also experience chest discomfort. No associated nausea or diaphoresis or shortness of breath. Nonexertional. This occurred once again simply at the thought of returning to work.  Was trying to transfer out of the dept  At [employer], was placed on a performance caution  Lost sister back in august. This has led to protracted grief which is impacted on the patient considerably  Pt has several who report to her, but this has been taken away   Much stress at work  Five yrs  sist passed fr breast cancer  Pt worried about strress Cp tightness occurs as pt starts to worry about stress  Not having good sleep, back spasms at night      Review of Systems  Constitutional: Positive for irritability.  Respiratory: Positive for shortness of breath.   Cardiovascular: Positive for chest pain.       Objective:   Physical Exam  Alert no acute distress talkative. Tearful at times. HEENT normal normal. Oriented. Lungs clear. Heart regular rate and rhythm. Judgment appears intact. No hallucinations or delusions. No homicidal thoughts. Affect slightly depressed at times.      Assessment & Plan:  Impression #1 anxiety  and depression. Multiple etiologies. Including protracted grief response with loss of sister. Also considerable stress at work and home. #2 chest pain. EKG done primarily for reassurance after visit was over. Of note EKG did reveal right bundle branch block. No evidence of ischemia.patient's discomfort very much consistent with anxiety and stress with no cardinal symptoms associated which or ischemia. We'll discussed with patient at next visit. Plan initiate Lexapro rash. Diet discussed exercise discussed. Work excuse for a couple weeks. Clonazepam when necessary for sleep. WSL

## 2014-07-15 ENCOUNTER — Telehealth: Payer: Self-pay | Admitting: Family Medicine

## 2014-07-15 NOTE — Telephone Encounter (Signed)
Disability form was faxed [place] from [employer] and it has to be filled out by a doctor. This form is dealing with patient office visit and its not in your dication the question they are asking.and what will be the charge for this form normally its $10 Its has to be faxed by the 18th of the month.

## 2014-07-17 DIAGNOSIS — F32A Depression, unspecified: Secondary | ICD-10-CM | POA: Insufficient documentation

## 2014-07-17 DIAGNOSIS — F329 Major depressive disorder, single episode, unspecified: Secondary | ICD-10-CM | POA: Insufficient documentation

## 2014-07-17 DIAGNOSIS — G4700 Insomnia, unspecified: Secondary | ICD-10-CM | POA: Insufficient documentation

## 2014-07-26 ENCOUNTER — Encounter: Payer: Self-pay | Admitting: Family Medicine

## 2014-07-26 ENCOUNTER — Ambulatory Visit (INDEPENDENT_AMBULATORY_CARE_PROVIDER_SITE_OTHER): Payer: 59 | Admitting: Family Medicine

## 2014-07-26 VITALS — BP 134/86 | Ht 63.5 in | Wt 230.0 lb

## 2014-07-26 DIAGNOSIS — I4510 Unspecified right bundle-branch block: Secondary | ICD-10-CM

## 2014-07-26 DIAGNOSIS — F32A Depression, unspecified: Secondary | ICD-10-CM

## 2014-07-26 DIAGNOSIS — G4700 Insomnia, unspecified: Secondary | ICD-10-CM

## 2014-07-26 DIAGNOSIS — F329 Major depressive disorder, single episode, unspecified: Secondary | ICD-10-CM

## 2014-07-26 NOTE — Progress Notes (Signed)
   Subjective:    Patient ID: Deanna Whitaker, female    DOB: 01/31/1966, 48 y.o.   MRN: [id number]  HPIFollow up on anxiety and depression. Started lexapro and klonopin. No problems with meds.   Patient also having substantial difficulty sleeping. States the Klonopin has helped this.  No longer experiencing any chest discomfort. Of note strong family history of coronary artery disease.   Pt requesting a referral for her depression to get her short term disability leave.  Pt declines flu vaccine.   Pt feeling a lot better on the meds  bp is dfown and feeling better  Sleeping well during the night  Feeling normal self again  bp numb back up last night when stressed  Walking more, sleeping better  No obs s. E.'s to the meds  Using klonpin only rarely   Day [phone] Review of Systems No headache no chest pain no back pain no abdominal pain no change in bowel habits no blood in stool    Objective:   Physical Exam   Alert no acute distress. Vitals stable. Blood pressure good on repeat. HEENT normal. Lungs clear. Heart regular in rhythm.     Assessment & Plan:  Impression 1 insomnia improved #2 chest pain resolved #3 EKG results discussed today. Patient had isolated right bundle branch block with normal sinus rhythm. #4 depression clinically improved #5 workplace demands they are is insisting on patient T go under care of psychiatrist for her depression plan cardiology referral rationale discussed. Psychiatry referral per workplace request diet discussed encourage compliance with medicines encourage. WSL

## 2014-07-27 ENCOUNTER — Encounter: Payer: Self-pay | Admitting: Family Medicine

## 2014-08-01 ENCOUNTER — Encounter: Payer: Self-pay | Admitting: Internal Medicine

## 2014-08-01 ENCOUNTER — Ambulatory Visit (INDEPENDENT_AMBULATORY_CARE_PROVIDER_SITE_OTHER): Payer: 59 | Admitting: Internal Medicine

## 2014-08-01 VITALS — BP 140/94 | HR 68 | Ht 63.0 in | Wt 229.0 lb

## 2014-08-01 DIAGNOSIS — R0789 Other chest pain: Secondary | ICD-10-CM

## 2014-08-01 DIAGNOSIS — R9431 Abnormal electrocardiogram [ECG] [EKG]: Secondary | ICD-10-CM

## 2014-08-01 NOTE — Progress Notes (Signed)
HPI patinet referred by Dr Gerda DissLuking for CP  Usually occurred at work when stressed  Only spelll she was concerned about was when overheated at [employer] game  Got SOB and CP    Pain was a pressure  Not stabbing.  WOuld last 15 min at most.   Last time at work was about 4 to 5 wks ago. Now out of work   No spells with activity  Does not exercise.   No Known Allergies  Current Outpatient Prescriptions  Medication Sig Dispense Refill  . clonazePAM (KLONOPIN) 1 MG tablet 1 tablet po qhs prn sleep. 1/2 tablet po during the day prn anxiety 30 tablet 2   No current facility-administered medications for this visit.    No past medical history on file.  Mother with CAD  S/p CABG around age 48  SHe was a smoker    Past Surgical History  Procedure Laterality Date  . Abdominal hysterectomy      No family history on file.  History   Social History  . Marital Status: Divorced    Spouse Name: N/A    Number of Children: N/A  . Years of Education: N/A   Occupational History  . Not on file.   Social History Main Topics  . Smoking status: Never Smoker   . Smokeless tobacco: Never Used  . Alcohol Use: No  . Drug Use: No  . Sexual Activity: Not on file   Other Topics Concern  . Not on file   Social History Narrative    Review of Systems:  All systems reviewed.  They are negative to the above problem except as previously stated.  Vital Signs: BP 140/94 mmHg  Pulse 68  Ht 5\' 3"  (1.6 m)  Wt 229 lb (103.874 kg)  BMI 40.58 kg/m2  Physical Exam Patient is in NAD HEENT:  Normocephalic, atraumatic. EOMI, PERRLA.  Neck: JVP is normal.  No bruits.  Lungs: clear to auscultation. No rales no wheezes.  Heart: Regular rate and rhythm. Normal S1, S2. No S3.   No significant murmurs. PMI not displaced.  Abdomen:  Supple, nontender. Normal bowel sounds. No masses. No hepatomegaly.  Extremities:   Good distal pulses throughout. No lower extremity edema.  Musculoskeletal :moving all extremities.   Neuro:   alert and oriented x3.  CN II-XII grossly intact.  EKG  SR62 bpm    RBBB  LAFB  Assessment and Plan: 1.  CP  I think the patient's symptoms are very atypical for coronary ischemia  ? GI ? Muscular I encouraged her to get more active and I would follow       WOuld schedule echo   I owuld not sched furhter testing unless echo abnormal   2.  HTN  BP high today  WIll need to be followed.

## 2014-08-01 NOTE — Patient Instructions (Signed)
Your physician recommends that you schedule a follow-up appointment in: only as needed      Your physician has requested that you have an echocardiogram. Echocardiography is a painless test that uses sound waves to create images of your heart. It provides your doctor with information about the size and shape of your heart and how well your heart's chambers and valves are working. This procedure takes approximately one hour. There are no restrictions for this procedure.       Thank you for choosing [facility] !

## 2014-08-02 ENCOUNTER — Telehealth: Payer: Self-pay | Admitting: Family Medicine

## 2014-08-02 NOTE — Telephone Encounter (Signed)
Patient called today stating she was denied her short term disability because of not enough information to support her diagnosis and she wants to know what can be done to fix it.Attached is the paper work I faxed to [employer].

## 2014-08-02 NOTE — Telephone Encounter (Signed)
i am not at all surprised considering the tone of the questionaires they sent, we can send copis of all notes to the insur with pts insurer. Her insur co is insisting on a psychiatrist to provide the excuse and support for this rationale so I am not surprised they denied our rec, ref is in process

## 2014-08-08 ENCOUNTER — Telehealth: Payer: Self-pay | Admitting: Internal Medicine

## 2014-08-08 ENCOUNTER — Ambulatory Visit (HOSPITAL_COMMUNITY)
Admission: RE | Admit: 2014-08-08 | Discharge: 2014-08-08 | Disposition: A | Discharge status: Home / Self Care | Payer: 59 | Source: Ambulatory Visit | Attending: Internal Medicine | Admitting: Internal Medicine

## 2014-08-08 DIAGNOSIS — R9431 Abnormal electrocardiogram [ECG] [EKG]: Secondary | ICD-10-CM | POA: Diagnosis present

## 2014-08-08 DIAGNOSIS — I517 Cardiomegaly: Secondary | ICD-10-CM

## 2014-08-08 DIAGNOSIS — R0789 Other chest pain: Secondary | ICD-10-CM

## 2014-08-08 NOTE — Telephone Encounter (Signed)
Pt received letter from Dr. Gerda DissLuking office and Ok'd pt to return to work but needs note from our office since referred here. Pt had echo done this morning. Will forward to Dr. Tenny Crawoss for ok to write letter. Pt stated work note is needed by tomorrow or at the end of the day today, explained to pt that echo would need to be read by Dr. Tenny Crawoss and pt just had echo today.

## 2014-08-08 NOTE — Telephone Encounter (Signed)
Needs letter to return to work / please call with any questions / tgs

## 2014-08-08 NOTE — Progress Notes (Signed)
  Echocardiogram 2D Echocardiogram has been performed.  Deanna Whitaker 08/08/2014, 9:29 AM

## 2014-08-10 DIAGNOSIS — Z029 Encounter for administrative examinations, unspecified: Secondary | ICD-10-CM

## 2014-08-11 NOTE — Telephone Encounter (Signed)
-----   Message from Dietrich PatesPaula Ross V, MD sent at 08/11/2014  3:18 PM EST ----- Pumping function of heart is overall normal  Difficult study though I would recomm a stress myoview to confirm that blood flow OK to all regions of heart.

## 2014-08-11 NOTE — Telephone Encounter (Signed)
Pt just returned to work and cannot take off again until after [employer].She will call back to schedule apt

## 2014-09-21 ENCOUNTER — Encounter (HOSPITAL_COMMUNITY): Payer: Self-pay | Admitting: Psychiatry

## 2014-09-21 ENCOUNTER — Ambulatory Visit (INDEPENDENT_AMBULATORY_CARE_PROVIDER_SITE_OTHER): Payer: 59 | Admitting: Psychiatry

## 2014-09-21 VITALS — BP 130/82 | Ht 63.0 in | Wt 229.0 lb

## 2014-09-21 DIAGNOSIS — F32A Depression, unspecified: Secondary | ICD-10-CM

## 2014-09-21 DIAGNOSIS — F329 Major depressive disorder, single episode, unspecified: Secondary | ICD-10-CM

## 2014-09-21 DIAGNOSIS — F411 Generalized anxiety disorder: Secondary | ICD-10-CM

## 2014-09-21 NOTE — Progress Notes (Signed)
Psychiatric Assessment Adult  Patient Identification:  Deanna Whitaker Date of Evaluation:  09/21/2014 Chief Complaint: "I was depressed and stressed out" History of Chief Complaint:   Chief Complaint  Patient presents with  . Depression  . Anxiety  . Establish Care    HPI this patient is a 49 year old divorced black female who lives with her 2 sons [place] 4615 and 6229 [place]. 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  The patient was referred by her primary physician, Dr. Simone CuriaStephen Luking, for further assessment of depression and anxiety.  The patient states that her difficulties began early in 2015. She is having difficulties getting along with her boss and felt belittled and ridiculed particularly during her performance assessments. Even though she had a lot of responsibility at work she was told that she didn't have logical reasoning skills. He is also asked to take a course that she really didn't have time to study 4 and she ended up failing at which was an added pressure. Her sister died of breast cancer in August 2015 which added to her stress.  The patient began to get increasingly depressed, having chest pain back spasms which Dr. Gerda DissLuking diagnosed as anxiety and panic attacks. The stress at work got so much for the patient that in early November 2015 Dr. Alfonzo FellerlLuking took her out of work for 30 days and she was placed on both Lexapro and clonazepam. On a follow-up visit later in the month it looked as if the medications were helpful as she was sleeping better her mood had improved and she was no longer stressed. She retook the course that she failed and passed it. While she was going through all this she had numerous symptoms of depression including poor sleep low appetite racing thoughts excessive worry lack of interest and panic attacks. She states most of this has resolved and she's no longer taking either of the medications.  The patient went back to work in early December and finds that  her supervisor is far more supportive now. She still made a couple of mean comments but overall she feels like she can handle the job. In the meantime she is applied for other jobs both within and outside her company. Her energy is good her mood is improved and she is sleeping well. She's never been suicidal has no previous psychiatric treatment and has no history of substance abuse Review of Systems  Psychiatric/Behavioral: Positive for dysphoric mood. The patient is nervous/anxious.   All other systems reviewed and are negative.  Physical Exam not done  Depressive Symptoms: depressed mood, psychomotor retardation, feelings of worthlessness/guilt, anxiety, panic attacks,  (Hypo) Manic Symptoms:   Elevated Mood:  No Irritable Mood:  No Grandiosity:  No Distractibility:  No Labiality of Mood:  No Delusions:  No Hallucinations:  No Impulsivity:  No Sexually Inappropriate Behavior:  No Financial Extravagance:  No Flight of Ideas:  No  Anxiety Symptoms: Excessive Worry:  Yes Panic Symptoms:  Yes Agoraphobia:  No Obsessive Compulsive: No  Symptoms: None, Specific Phobias:  No Social Anxiety:  No  Psychotic Symptoms:  Hallucinations: No None Delusions:  No Paranoia:  No   Ideas of Reference:  No  PTSD Symptoms: Ever had a traumatic exposure:  No Had a traumatic exposure in the last month:  No Re-experiencing: No None Hypervigilance:  No Hyperarousal: No None Avoidance: No None  Traumatic Brain Injury: No  Past Psychiatric History: Diagnosis: Depression and anxiety   Hospitalizations: none  Outpatient Care: Only  through primary care   Substance Abuse Care: none  Self-Mutilation: none  Suicidal Attempts: none  Violent Behaviors: none   Past Medical History:  History reviewed. No pertinent past medical history. History of Loss of Consciousness:  No Seizure History:  No Cardiac History:  Yes Allergies:  No Known Allergies Current Medications:  Current  Outpatient Prescriptions  Medication Sig Dispense Refill  . clonazePAM (KLONOPIN) 1 MG tablet 1 tablet po qhs prn sleep. 1/2 tablet po during the day prn anxiety (Patient not taking: Reported on 09/21/2014) 30 tablet 2   No current facility-administered medications for this visit.    Previous Psychotropic Medications:  Medication Dose   Lexapro and clonazepam                        Substance Abuse History in the last 12 months: Substance Age of 1st Use Last Use Amount Specific Type  Nicotine      Alcohol      Cannabis      Opiates      Cocaine      Methamphetamines      LSD      Ecstasy      Benzodiazepines      Caffeine      Inhalants      Others:                          Medical Consequences of Substance Abuse: none  Legal Consequences of Substance Abuse:none  Family Consequences of Substance Abuse: none  Blackouts:  No DT's:  No Withdrawal Symptoms:  No None  Social History: Current Place [place] Residence: [place] Birth[place] Family Members: 2 sons, 5 sisters, 7 brothers Marital Status:  Divorced Children:   Sons: 2  Daughters:  Relationships:  Education:  Corporate treasurer Problems/Performance:  Religious Beliefs/Practices: Christian History of Abuse: none Armed forces technical officer; Industrial/product designer History:  None. Legal History: none Hobbies/Interests: Church, spending time with family  Family History:  History reviewed. No pertinent family history.  Mental Status Examination/Evaluation: Objective:  Appearance: Neat and Well Groomed  Eye Contact::  Good  Speech:  Clear and Coherent  Volume:  Normal  Mood:  Good today   Affect:  Appropriate  Thought Process:  Goal Directed  Orientation:  Full (Time, Place, and Person)  Thought Content:  WDL  Suicidal Thoughts:  No  Homicidal Thoughts:  No  Judgement:  Good  Insight:  Good  Psychomotor Activity:  Normal  Akathisia:  No  Handed:   Right  AIMS (if indicated):    Assets:  Communication Skills Desire for Improvement Physical Health Resilience Social Support Vocational/Educational    Laboratory/X-Ray Psychological Evaluation(s)   Reviewed in chart      Assessment:  Axis I: Generalized Anxiety Disorder and Major Depression, single episode  AXIS I Generalized Anxiety Disorder and Major Depression, single episode  AXIS II Deferred  AXIS III History reviewed. No pertinent past medical history.   AXIS IV occupational problems  AXIS V 61-70 mild symptoms   Treatment Plan/Recommendations: Patient was asked to see me for a second opinion regarding the symptoms that kept her out of work. In reviewing her records her symptoms seem congruent with generalized anxiety disorder and Maj. depression at the time. However the symptoms have now resolved and she is no longer experiencing them even without medication  Plan of Care: Medication management although patient does not  feel medications are currently necessary   Laboratory:   Psychotherapy: Declines at this time   Medications: None   Routine PRN Medications:  No  Consultations:   Safety Concerns:  none  Other: She doesn't feel that she needs further psychiatric treatment at this time and will return to see me on an as-needed basis     Diannia Ruder, MD 1/13/20162:27 PM

## 2014-11-22 ENCOUNTER — Encounter: Payer: Self-pay | Admitting: Family Medicine

## 2014-11-23 ENCOUNTER — Other Ambulatory Visit: Payer: Self-pay | Admitting: Obstetrics and Gynecology

## 2014-11-23 DIAGNOSIS — N6459 Other signs and symptoms in breast: Secondary | ICD-10-CM

## 2014-11-25 ENCOUNTER — Ambulatory Visit
Admission: RE | Admit: 2014-11-25 | Discharge: 2014-11-25 | Disposition: A | Discharge status: Home / Self Care | Payer: 59 | Source: Ambulatory Visit | Attending: Obstetrics and Gynecology | Admitting: Obstetrics and Gynecology

## 2014-11-25 DIAGNOSIS — N6459 Other signs and symptoms in breast: Secondary | ICD-10-CM

## 2015-02-13 ENCOUNTER — Emergency Department (HOSPITAL_COMMUNITY): Payer: Medicaid Other

## 2015-02-13 ENCOUNTER — Encounter (HOSPITAL_COMMUNITY): Payer: Self-pay

## 2015-02-13 ENCOUNTER — Emergency Department (HOSPITAL_COMMUNITY)
Admission: EM | Admit: 2015-02-13 | Discharge: 2015-02-13 | Disposition: A | Discharge status: Home / Self Care | Payer: Medicaid Other | Attending: Emergency Medicine | Admitting: Emergency Medicine

## 2015-02-13 DIAGNOSIS — Y998 Other external cause status: Secondary | ICD-10-CM | POA: Insufficient documentation

## 2015-02-13 DIAGNOSIS — S82841A Displaced bimalleolar fracture of right lower leg, initial encounter for closed fracture: Secondary | ICD-10-CM | POA: Insufficient documentation

## 2015-02-13 DIAGNOSIS — Y9389 Activity, other specified: Secondary | ICD-10-CM | POA: Diagnosis not present

## 2015-02-13 DIAGNOSIS — Y9289 Other specified places as the place of occurrence of the external cause: Secondary | ICD-10-CM | POA: Insufficient documentation

## 2015-02-13 DIAGNOSIS — W109XXA Fall (on) (from) unspecified stairs and steps, initial encounter: Secondary | ICD-10-CM | POA: Insufficient documentation

## 2015-02-13 DIAGNOSIS — S99911A Unspecified injury of right ankle, initial encounter: Secondary | ICD-10-CM | POA: Diagnosis present

## 2015-02-13 MED ORDER — OXYCODONE-ACETAMINOPHEN 5-325 MG PO TABS
2.0000 | ORAL_TABLET | Freq: Once | ORAL | Status: AC
  Administered 2015-02-13: 2 via ORAL
  Filled 2015-02-13: qty 2

## 2015-02-13 MED ORDER — OXYCODONE-ACETAMINOPHEN 5-325 MG PO TABS: 1.0000 | ORAL_TABLET | Freq: Four times a day (QID) | ORAL | Status: DC | PRN

## 2015-02-13 MED ORDER — HYDROMORPHONE HCL 1 MG/ML IJ SOLN
1.0000 mg | Freq: Once | INTRAMUSCULAR | Status: AC
  Administered 2015-02-13: 1 mg via INTRAVENOUS
  Filled 2015-02-13: qty 1

## 2015-02-13 MED ORDER — ONDANSETRON HCL 4 MG/2ML IJ SOLN
4.0000 mg | Freq: Once | INTRAMUSCULAR | Status: AC
  Administered 2015-02-13: 4 mg via INTRAVENOUS
  Filled 2015-02-13: qty 2

## 2015-02-13 NOTE — Discharge Instructions (Signed)
° °  Bimalleolar Fracture, Ankle, Adult, Displaced (ORIF), Care After Read the instructions outlined below and refer to this sheet in the next few weeks. These discharge instructions provide you with general information on caring for yourself after you leave the hospital. Your doctor may also give you specific instructions. While your treatment has been planned according to the most current medical practices available, unavoidable complications occasionally occur. If you have any problems or questions after discharge, please call your caregiver. HOME CARE INSTRUCTIONS  You may resume normal diet and activities as directed or allowed. Use crutches as instructed.  Keep ice packs (a bag of ice wrapped in a towel) on the surgical area for 15-20 minutes, 03-04 times per day, for the first two days following surgery. Use the ice only if OK with your surgeon or caregiver.  Change dressings if necessary or as directed.  If you have a plaster or fiberglass splint or cast:  Do not try to scratch the skin under the cast using sharp or pointed objects.  Check the skin around the cast every day. You may put lotion on any red or sore areas.  Keep your cast or splint dry and clean.  Do not put pressure on any part of your cast or splint until it is fully hardened.  Your cast or splint can be protected during bathing with a plastic bag. Do not lower the cast or splint into water.  Take prescribed medication as directed. Only take over-the-counter or prescription medicines for pain, discomfort, or fever as directed by your caregiver.  Use crutches as directed and do not exercise leg unless instructed.  These are not fractures to be taken lightly! If the fracture displaces and gets out of position, it may eventually lead to arthritis and disability for the rest of your life. Problems often follow even the best of care.  Follow all instructions given to you by your caregiver, make and keep follow up  appointments. SEEK IMMEDIATE MEDICAL CARE IF:  You develop redness, swelling, numbness or increasing pain in the wound.  There is pus coming from the wound.  An unexplained oral temperature above 102 F (38.9 C) develops.  A bad smell is coming from the wound or dressing.  A breaking open of the wound (edges not staying together) occurs after stitches or staples have been removed. If you do not have a window in your cast for observing the wound, a discharge or minor bleeding may show up as a stain on the outside of your cast immediately after surgery. Report these findings to your caregiver. Document Released: 03/15/2005 Document Revised: 06/16/2013 Document Reviewed: 03/07/2009 [facility] Patient Information 2015 [place], [place]. This information is not intended to replace advice given to you by your health care provider. Make sure you discuss any questions you have with your health care provider. As discussed, you have a ankle fracture.  You have been placed in a splint provided with crutches help with ambulation, toe-touch for balance.  No weightbearing on your foot.  Follow-up with Dr. Dion SaucierLandau in the office either today or tomorrow.  You've also been given a prescription for pain control

## 2015-02-13 NOTE — Progress Notes (Signed)
Orthopedic Tech Progress Note Patient Details:  Cassandria AngerWanda F Sunga 06/21/1966 [id number]  Ortho Devices Type of Ortho Device: Ace wrap, Crutches, Post (short leg) splint Ortho Device/Splint Interventions: Application   Cammer, Mickie BailJennifer Carol 02/13/2015, 6:37 AM

## 2015-02-13 NOTE — ED Notes (Signed)
Patient returned from X-ray 

## 2015-02-13 NOTE — ED Notes (Signed)
Per EMS: Pt walking down steps at home. Missed a step and fell ~10 steps. Pt denies any LOC or dizziness, simply missed step. Denies any back/neck pain. Is complaining of pain to R ankle. Obvious swelling. 2+ pedal pulses. EMS gave . Fentanyl IV. PNS intact.

## 2015-02-13 NOTE — ED Provider Notes (Signed)
CSN: [id number]     Arrival date & time 02/13/15  0324 History   First MD Initiated Contact with Patient 02/13/15 0329     Chief Complaint  Patient presents with  . Ankle Pain     (Consider location/radiation/quality/duration/timing/severity/associated sxs/prior Treatment) HPI Comments: H and states she woke up about 1:00 in the morning and felt thirsty.  She was going down stairs to get a junk of water when she slipped on the surface and tumbled down several steps.  Her son was able to catch her mid staircase.  She did not hit her head did not hit her back or neck.  Initially she thought she could stay home and follow-up with her primary care doctor, but the pain became too bad.  Patient is a 49 y.o. female presenting with ankle pain. The history is provided by the patient.  Ankle Pain Location:  Ankle Time since incident:  2 hours Injury: yes   Mechanism of injury: fall   Fall:    Fall occurred:  Down stairs   Impact surface:  Stairs   Entrapped after fall: no   Ankle location:  R ankle Pain details:    Quality:  Aching   Radiates to:  Does not radiate   Severity:  Severe   Onset quality:  Sudden   Timing:  Constant Chronicity:  New Dislocation: no   Prior injury to area:  No Worsened by:  Activity Associated symptoms: swelling   Associated symptoms: no fever and no numbness     History reviewed. No pertinent past medical history. Past Surgical History  Procedure Laterality Date  . Abdominal hysterectomy     History reviewed. No pertinent family history. History  Substance Use Topics  . Smoking status: Never Smoker   . Smokeless tobacco: Never Used  . Alcohol Use: No   OB History    No data available     Review of Systems  Constitutional: Negative for fever.  Musculoskeletal: Positive for joint swelling.  Skin: Positive for color change.  Neurological: Negative for numbness.  All other systems reviewed and are negative.     Allergies  Review of  patient's allergies indicates no known allergies.  Home Medications   Prior to Admission medications   Medication Sig Start Date End Date Taking? Authorizing Provider  clonazePAM (KLONOPIN) 1 MG tablet 1 tablet po qhs prn sleep. 1/2 tablet po during the day prn anxiety Patient not taking: Reported on 09/21/2014 07/12/14   Merlyn AlbertWilliam S Luking, MD  oxyCODONE-acetaminophen (PERCOCET/ROXICET) 5-325 MG per tablet Take 1-2 tablets by mouth every 6 (six) hours as needed for severe pain. 02/13/15   Earley FavorGail Joesphine Schemm, NP   BP 138/82 mmHg  Pulse 78  Temp(Src) 98 F (36.7 C) (Oral)  Resp 18  Ht 5\' 3"  (1.6 m)  Wt 225 lb (102.059 kg)  BMI 39.87 kg/m2  SpO2 99% Physical Exam  Constitutional: She is oriented to person, place, and time. She appears well-developed and well-nourished.  HENT:  Head: Normocephalic.  Eyes: Pupils are equal, round, and reactive to light.  Neck: Normal range of motion.  Cardiovascular: Normal rate and regular rhythm.   Pulmonary/Chest: Effort normal and breath sounds normal.  Abdominal: Soft. She exhibits no distension. There is no tenderness.  Musculoskeletal: She exhibits tenderness. She exhibits no edema.       Right ankle: She exhibits decreased range of motion, swelling and ecchymosis. She exhibits no deformity. Tenderness.  Neurological: She is alert and oriented to person, place, and  time.  Skin: Skin is warm. No erythema.  Nursing note and vitals reviewed.   ED Course  Procedures (including critical care time) Labs Review Labs Reviewed - No data to display  Imaging Review Dg Ankle Complete Right  02/13/2015   CLINICAL DATA:  Left ankle pain after fall down several steps tonight. Swelling.  EXAM: RIGHT ANKLE - COMPLETE 3+ VIEW  COMPARISON:  None.  FINDINGS: Displaced bimalleolar fracture. Transverse fracture of the medial malleolus at the level of the tibial talar joint with small fracture fragment in the medial ankle mortise. Mildly displaced comminuted oblique fracture  of the distal fibula. There is widening of the medial clear space. Diffuse soft tissue edema about the ankle. Plantar calcaneal spur and os peroneal, incidentally noted.  IMPRESSION: Displaced bimalleolar fracture with widening of the medial clear space.   Electronically Signed   By: Rubye Oaks M.D.   On: 02/13/2015 04:41     EKG Interpretation None     Spoke with Dr. Dion Saucier who would like to patient placed in a splint, crutches, pain medication and follow-up in the office MDM   Final diagnoses:  Bimalleolar ankle fracture, right, closed, initial encounter         Earley Favor, NP 02/13/15 1610  Tomasita Crumble, MD 02/13/15 1646

## 2015-02-14 ENCOUNTER — Other Ambulatory Visit (HOSPITAL_COMMUNITY): Payer: Self-pay | Admitting: Orthopaedic Surgery

## 2015-02-15 NOTE — Pre-Procedure Instructions (Addendum)
Deanna AngerWanda F Meder  02/15/2015      [id number], [place] - [address] [place] [address] Phone: [phone] Fax: [phone]    Your procedure is scheduled on Fri, June 10 @ 7:30 AM  [facility] 5:30 AM  Call this number if you have problems the morning of surgery:  [phone]   Remember:  Do not eat food or drink liquids after midnight.  Take these medicines the morning of surgery with A SIP OF WATER:Clonazepam(Klonopin) and Pain Pill(if needed)              No Goody's,BC's,Aleve,Aspirin,Ibuprofen,Fish Oil,or any Herbal Medications.     Do not wear jewelry, make-up or nail polish.  Do not wear lotions, powders, or perfumes.  You may wear deodorant.  Do not shave 48 hours prior to surgery.    Do not bring valuables to the hospital.  [facility] not responsible for any belongings or valuables.  Contacts, dentures or bridgework may not be worn into surgery.  Leave your suitcase in the car.  After surgery it may be brought to your room.  For patients admitted to the hospital, discharge time will be determined by your treatment team.  Patients discharged the day of surgery will not be allowed to drive home.    Special instructions:   - Preparing for Surgery  Before surgery, you can play an important role.  Because skin is not sterile, your skin needs to be as free of germs as possible.  You can reduce the number of germs on you skin by washing with CHG (chlorahexidine gluconate) soap before surgery.  CHG is an antiseptic cleaner which kills germs and bonds with the skin to continue killing germs even after washing.  Please DO NOT use if you have an allergy to CHG or antibacterial soaps.  If your skin becomes reddened/irritated stop using the CHG and inform your nurse when you arrive at Short Stay.  Do not shave (including legs and underarms) for at least 48 hours prior to  the first CHG shower.  You may shave your face.  Please follow these instructions carefully:   1.  Shower with CHG Soap the night before surgery and the                                morning of Surgery.  2.  If you choose to wash your hair, wash your hair first as usual with your       normal shampoo.  3.  After you shampoo, rinse your hair and body thoroughly to remove the                      Shampoo.  4.  Use CHG as you would any other liquid soap.  You can apply chg directly       to the skin and wash gently with scrungie or a clean washcloth.  5.  Apply the CHG Soap to your body ONLY FROM THE NECK DOWN.        Do not use on open wounds or open sores.  Avoid contact with your eyes,       ears, mouth and genitals (private parts).  Wash genitals (private parts)       with your normal soap.  6.  Wash thoroughly, paying special attention to  the area where your surgery        will be performed.  7.  Thoroughly rinse your body with warm water from the neck down.  8.  DO NOT shower/wash with your normal soap after using and rinsing off       the CHG Soap.  9.  Pat yourself dry with a clean towel.            10.  Wear clean pajamas.            11.  Place clean sheets on your bed the night of your first shower and do not        sleep with pets.  Day of Surgery  Do not apply any lotions/deoderants the morning of surgery.  Please wear clean clothes to the hospital/surgery center.    Please read over the following fact sheets that you were given. Pain Booklet, Coughing and Deep Breathing, MRSA Information and Surgical Site Infection Prevention

## 2015-02-16 ENCOUNTER — Encounter (HOSPITAL_COMMUNITY)
Admission: RE | Admit: 2015-02-16 | Discharge: 2015-02-16 | Disposition: A | Discharge status: Home / Self Care | Payer: Medicaid Other | Source: Ambulatory Visit | Attending: Orthopaedic Surgery | Admitting: Orthopaedic Surgery

## 2015-02-16 ENCOUNTER — Ambulatory Visit (HOSPITAL_COMMUNITY)
Admission: RE | Admit: 2015-02-16 | Discharge: 2015-02-16 | Disposition: A | Discharge status: Home / Self Care | Payer: Medicaid Other | Source: Ambulatory Visit | Attending: Orthopaedic Surgery | Admitting: Orthopaedic Surgery

## 2015-02-16 ENCOUNTER — Encounter (HOSPITAL_COMMUNITY): Payer: Self-pay

## 2015-02-16 DIAGNOSIS — S82841A Displaced bimalleolar fracture of right lower leg, initial encounter for closed fracture: Secondary | ICD-10-CM

## 2015-02-16 DIAGNOSIS — Z01818 Encounter for other preprocedural examination: Secondary | ICD-10-CM | POA: Insufficient documentation

## 2015-02-16 DIAGNOSIS — E669 Obesity, unspecified: Secondary | ICD-10-CM | POA: Insufficient documentation

## 2015-02-16 DIAGNOSIS — Z6839 Body mass index (BMI) 39.0-39.9, adult: Secondary | ICD-10-CM | POA: Diagnosis not present

## 2015-02-16 DIAGNOSIS — Z01812 Encounter for preprocedural laboratory examination: Secondary | ICD-10-CM | POA: Diagnosis not present

## 2015-02-16 DIAGNOSIS — W109XXA Fall (on) (from) unspecified stairs and steps, initial encounter: Secondary | ICD-10-CM | POA: Insufficient documentation

## 2015-02-16 HISTORY — DX: Depression, unspecified: F32.A

## 2015-02-16 HISTORY — DX: Anxiety disorder, unspecified: F41.9

## 2015-02-16 HISTORY — DX: Major depressive disorder, single episode, unspecified: F32.9

## 2015-02-16 LAB — URINALYSIS, ROUTINE W REFLEX MICROSCOPIC
GLUCOSE, UA: NEGATIVE mg/dL
Hgb urine dipstick: NEGATIVE
Ketones, ur: 40 mg/dL — AB
LEUKOCYTES UA: NEGATIVE
NITRITE: NEGATIVE
Protein, ur: NEGATIVE mg/dL
SPECIFIC GRAVITY, URINE: 1.027 (ref 1.005–1.030)
Urobilinogen, UA: 1 mg/dL (ref 0.0–1.0)
pH: 5.5 (ref 5.0–8.0)

## 2015-02-16 LAB — CBC
HCT: 43 % (ref 36.0–46.0)
Hemoglobin: 15 g/dL (ref 12.0–15.0)
MCH: 31.6 pg (ref 26.0–34.0)
MCHC: 34.9 g/dL (ref 30.0–36.0)
MCV: 90.7 fL (ref 78.0–100.0)
Platelets: 244 10*3/uL (ref 150–400)
RBC: 4.74 MIL/uL (ref 3.87–5.11)
RDW: 12.7 % (ref 11.5–15.5)
WBC: 12.7 10*3/uL — ABNORMAL HIGH (ref 4.0–10.5)

## 2015-02-16 LAB — COMPREHENSIVE METABOLIC PANEL
ALT: 42 U/L (ref 14–54)
ANION GAP: 10 (ref 5–15)
AST: 39 U/L (ref 15–41)
Albumin: 3.6 g/dL (ref 3.5–5.0)
Alkaline Phosphatase: 60 U/L (ref 38–126)
BILIRUBIN TOTAL: 1 mg/dL (ref 0.3–1.2)
BUN: 7 mg/dL (ref 6–20)
CO2: 25 mmol/L (ref 22–32)
Calcium: 8.9 mg/dL (ref 8.9–10.3)
Chloride: 101 mmol/L (ref 101–111)
Creatinine, Ser: 0.82 mg/dL (ref 0.44–1.00)
GFR calc non Af Amer: >60 mL/min (ref >60)
Glucose, Bld: 120 mg/dL — ABNORMAL HIGH (ref 65–99)
Potassium: 3.5 mmol/L (ref 3.5–5.1)
Sodium: 136 mmol/L (ref 135–145)
Total Protein: 7.3 g/dL (ref 6.5–8.1)

## 2015-02-16 LAB — PROTIME-INR
INR: 1.1 (ref 0.00–1.49)
Prothrombin Time: 14.4 seconds (ref 11.6–15.2)

## 2015-02-16 LAB — SURGICAL PCR SCREEN
MRSA, PCR: NEGATIVE
Staphylococcus aureus: POSITIVE — AB

## 2015-02-16 MED ORDER — CEFAZOLIN SODIUM-DEXTROSE 2-3 GM-% IV SOLR
2.0000 g | INTRAVENOUS | Status: AC
  Administered 2015-02-17: 2 g via INTRAVENOUS
  Filled 2015-02-16: qty 50

## 2015-02-16 NOTE — Progress Notes (Signed)
Pt. Notified of positive PCR screening. Stated she has no way to pick up Mupirocin  prescription. Will start Mupirocin DOS.

## 2015-02-16 NOTE — Progress Notes (Signed)
   02/16/15 0935  OBSTRUCTIVE SLEEP APNEA  Have you ever been diagnosed with sleep apnea through a sleep study? No  Do you snore loudly (loud enough to be heard through closed doors)?  1  Do you often feel tired, fatigued, or sleepy during the daytime? 1  Has anyone observed you stop breathing during your sleep? 0  Do you have, or are you being treated for high blood pressure? 0  BMI more than 35 kg/m2? 1  Age over 49 years old? 0  Neck circumference greater than 40 cm/16 inches? 1  Gender: 0

## 2015-02-16 NOTE — Progress Notes (Signed)
Anesthesia PAT Evaluation:  Patient is a 49 year old female scheduled for ORIF of right ankle fracture tomorrow at 0730 by Dr. Ophelia Charter.  Anesthesia is posted as Choice.  She slipped and feel down several stairs on 02/13/15 and sustained a right displaced bimalleolar fracture.  History includes anxiety, depression, obesity (BMI 39.87), non-smoker, hysterectomy '06.  PCP is Dr. Vilinda Blanks. Luking.  In 07/2014, she presented to Dr. Gerda Diss with complaints of high stress at work, grief for her sister's death in 09-May-2014 from breast cancer, and chest pain that occurred during periods of worry/stress. (Patient now says it was actually more symptoms of SOB and fast heart rate.) She was referred to psychiatry and cardiology. She saw cardiologist Dr. Dietrich Pates on 08/01/14.  She thought patient's symptoms were atypical for coronary ischemia and initially only ordered an echo (see below) but results could not exclude a wall motion abnormality so Dr. 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 I was asked to evaluate her because she did not complete the 2015 stress test.    Evaluation shows a pleasant black female in no acute distress.  She in in a wheelchair with a right leg in a soft cast/wrap.  There is some RLE edema.  Lungs are clear.  Heart RRR.  No murmurs heard. No carotid bruits noted.  She does have a short neck.  Mouth opening appears okay. I think Mallampati in class III. She denied chest pain, SOB, or significant palpitations.  She felt her symptoms of SOB and heart racing were more related to anxiety. She said she never really had chest pain.  Symptoms were never associated or aggravated by activity.  There was never any associated nausea, diaphoresis, jaw pain.  She is not diabetic. She used to work on the third floor and could use the stairs as long as she didn't go too fast.  Since she has been on work, and prior to her fracture, she was able to mow  her lawn with a push mower for 45 minutes without SOB, CP, syncope. She does have a family history of CAD in her mother who had a CABG around age 84.  Her mother was also a former smoker and diabetic.  Her mother ultimately died from a PE around age 46 after having prolonged bed rest for a "leg issue."  Patient was due to start a new job on Monday, but fortunately they are holding her position for the next 6-8 weeks while she recovers from surgery. She is ultimately planning to follow up with cardiology again once she gets insurance.     Meds include clonazepam, Percocet.  PAT Vitals: T 37.1 C, HR 75, RR 18, BP 152/70, O2 sat 97%.  07/12/14 EKG: SR, LAFB, RBBB, bifascicular block, RVH with repolarization abnormality.  08/08/14 Echo: - Left ventricle: The cavity size was normal. Wall thickness was increased in a pattern of mild LVH. Systolic function was normal. The estimated ejection fraction was in the range of 50% to 55%. Although no diagnostic regional wall motion abnormality was identified, this possibility cannot be completely excluded on the basis of this study. Doppler parameters are consistent with abnormal left ventricular relaxation (grade 1 diastolic dysfunction). Doppler parameters are consistent with elevated ventricular end-diastolic filling pressure. - Aortic valve: Mildly calcified annulus. Trileaflet. There was no significant regurgitation. - Mitral valve: There was trivial regurgitation. - Left atrium: The atrium was moderately dilated. - Right atrium: Central venous  pressure (est): 3 mm Hg. - Atrial septum: No defect or patent foramen ovale was identified. - Tricuspid valve: There was physiologic regurgitation. - Pulmonary arteries: Systolic pressure could not be accurately estimated. - Pericardium, extracardiac: There was no pericardial effusion. Impressions: - Mild LVH with LVEF 50-55%, grade 1 diastolic dysfunction with inceased filling  pressures. Moderate left atrial enlargement. Unable to assess PASP.  02/16/15 CXR IMPRESSION (Dr. David Swaziland):  1. Enlargement of the cardiac silhouette with mild central pulmonary vascular problem prominence. There is no pulmonary edema. These findings have improved since the previous study (11/10/09). Is there a history of CHF? 2. There is no pneumonia nor pleural effusion.  02/13/15 Right ankle xray: Displaced bimalleolar fracture with widening of the medial clear space.  Preoperative labs noted. CMET WNL except glucose 120.  WBC 12.7, H/H 15.0/43.0. PT/INR WNL.   I discussed above with anesthesiologist Dr. Glade Stanford.  Patient has had no further CV issues since she has not been working at her high stress job. She has been able to push mow her lawn without CV symptoms.  She is not diabetic.  She has an acute fracture and needs surgery. If no acute changes then it is anticipated that she should proceed as planned.   Velna [facility] Short Stay Center/Anesthesiology Phone [phone] 02/16/2015 2:19 PM

## 2015-02-17 ENCOUNTER — Encounter (HOSPITAL_COMMUNITY): Payer: Self-pay

## 2015-02-17 ENCOUNTER — Ambulatory Visit (HOSPITAL_COMMUNITY): Payer: Medicaid Other

## 2015-02-17 ENCOUNTER — Ambulatory Visit (HOSPITAL_COMMUNITY): Payer: Medicaid Other | Admitting: Anesthesiology

## 2015-02-17 ENCOUNTER — Observation Stay (HOSPITAL_COMMUNITY)
Admission: RE | Admit: 2015-02-17 | Discharge: 2015-02-18 | Disposition: A | Discharge status: Home / Self Care | Payer: Medicaid Other | Source: Ambulatory Visit | Attending: Orthopaedic Surgery | Admitting: Orthopaedic Surgery

## 2015-02-17 ENCOUNTER — Encounter (HOSPITAL_COMMUNITY)
Admission: RE | Disposition: A | Discharge status: Home / Self Care | Payer: Self-pay | Source: Ambulatory Visit | Attending: Orthopaedic Surgery

## 2015-02-17 ENCOUNTER — Ambulatory Visit (HOSPITAL_COMMUNITY): Payer: Medicaid Other | Admitting: Vascular Surgery

## 2015-02-17 DIAGNOSIS — Z6839 Body mass index (BMI) 39.0-39.9, adult: Secondary | ICD-10-CM | POA: Insufficient documentation

## 2015-02-17 DIAGNOSIS — S82899A Other fracture of unspecified lower leg, initial encounter for closed fracture: Secondary | ICD-10-CM | POA: Diagnosis present

## 2015-02-17 DIAGNOSIS — S82841A Displaced bimalleolar fracture of right lower leg, initial encounter for closed fracture: Principal | ICD-10-CM | POA: Insufficient documentation

## 2015-02-17 DIAGNOSIS — W109XXA Fall (on) (from) unspecified stairs and steps, initial encounter: Secondary | ICD-10-CM | POA: Insufficient documentation

## 2015-02-17 DIAGNOSIS — Y929 Unspecified place or not applicable: Secondary | ICD-10-CM | POA: Insufficient documentation

## 2015-02-17 DIAGNOSIS — S82843A Displaced bimalleolar fracture of unspecified lower leg, initial encounter for closed fracture: Secondary | ICD-10-CM | POA: Diagnosis present

## 2015-02-17 DIAGNOSIS — F419 Anxiety disorder, unspecified: Secondary | ICD-10-CM | POA: Insufficient documentation

## 2015-02-17 DIAGNOSIS — F329 Major depressive disorder, single episode, unspecified: Secondary | ICD-10-CM | POA: Diagnosis not present

## 2015-02-17 DIAGNOSIS — Z419 Encounter for procedure for purposes other than remedying health state, unspecified: Secondary | ICD-10-CM

## 2015-02-17 HISTORY — PX: ORIF ANKLE FRACTURE: SHX5408

## 2015-02-17 SURGERY — OPEN REDUCTION INTERNAL FIXATION (ORIF) ANKLE FRACTURE
Anesthesia: Choice | Site: Ankle | Laterality: Right

## 2015-02-17 MED ORDER — CHLORHEXIDINE GLUCONATE 4 % EX LIQD
60.0000 mL | Freq: Once | CUTANEOUS | Status: DC
Start: 2015-02-17 — End: 2015-02-17

## 2015-02-17 MED ORDER — MUPIROCIN 2 % EX OINT
1.0000 "application " | TOPICAL_OINTMENT | Freq: Once | CUTANEOUS | Status: AC
  Administered 2015-02-17: 1 via TOPICAL
  Filled 2015-02-17: qty 22

## 2015-02-17 MED ORDER — PROPOFOL 10 MG/ML IV BOLUS
INTRAVENOUS | Status: AC
  Filled 2015-02-17: qty 20

## 2015-02-17 MED ORDER — FENTANYL CITRATE (PF) 250 MCG/5ML IJ SOLN
INTRAMUSCULAR | Status: AC
  Filled 2015-02-17: qty 5

## 2015-02-17 MED ORDER — FENTANYL CITRATE (PF) 100 MCG/2ML IJ SOLN
INTRAMUSCULAR | Status: DC | PRN
  Administered 2015-02-17 (×2): 100 ug via INTRAVENOUS

## 2015-02-17 MED ORDER — ACETAMINOPHEN 650 MG RE SUPP: 650.0000 mg | Freq: Four times a day (QID) | RECTAL | Status: DC | PRN

## 2015-02-17 MED ORDER — HYDROMORPHONE HCL 1 MG/ML IJ SOLN
INTRAMUSCULAR | Status: AC
  Administered 2015-02-17: 0.5 mg via INTRAVENOUS
  Filled 2015-02-17: qty 1

## 2015-02-17 MED ORDER — ALBUTEROL SULFATE (2.5 MG/3ML) 0.083% IN NEBU
INHALATION_SOLUTION | RESPIRATORY_TRACT | Status: AC
  Filled 2015-02-17: qty 3

## 2015-02-17 MED ORDER — ONDANSETRON HCL 4 MG/2ML IJ SOLN: 4.0000 mg | Freq: Four times a day (QID) | INTRAMUSCULAR | Status: DC | PRN

## 2015-02-17 MED ORDER — SODIUM CHLORIDE 0.9 % IJ SOLN
INTRAMUSCULAR | Status: AC
  Filled 2015-02-17: qty 10

## 2015-02-17 MED ORDER — METHOCARBAMOL 500 MG PO TABS
500.0000 mg | ORAL_TABLET | Freq: Four times a day (QID) | ORAL | Status: DC | PRN
  Administered 2015-02-17 – 2015-02-18 (×2): 500 mg via ORAL
  Filled 2015-02-17 (×3): qty 1

## 2015-02-17 MED ORDER — MIDAZOLAM HCL 2 MG/2ML IJ SOLN
INTRAMUSCULAR | Status: AC
  Filled 2015-02-17: qty 2

## 2015-02-17 MED ORDER — LACTATED RINGERS IV SOLN
INTRAVENOUS | Status: DC | PRN
  Administered 2015-02-17 (×2): via INTRAVENOUS

## 2015-02-17 MED ORDER — ROCURONIUM BROMIDE 100 MG/10ML IV SOLN
INTRAVENOUS | Status: DC | PRN
  Administered 2015-02-17: 50 mg via INTRAVENOUS

## 2015-02-17 MED ORDER — NEOSTIGMINE METHYLSULFATE 10 MG/10ML IV SOLN
INTRAVENOUS | Status: DC | PRN
  Administered 2015-02-17: 5 mg via INTRAVENOUS

## 2015-02-17 MED ORDER — BUPIVACAINE HCL (PF) 0.25 % IJ SOLN
INTRAMUSCULAR | Status: DC | PRN
  Administered 2015-02-17: 30 mL via INTRA_ARTICULAR

## 2015-02-17 MED ORDER — BUPIVACAINE-EPINEPHRINE (PF) 0.25% -1:200000 IJ SOLN
INTRAMUSCULAR | Status: AC
  Filled 2015-02-17: qty 30

## 2015-02-17 MED ORDER — METHOCARBAMOL 500 MG PO TABS: 500.0000 mg | ORAL_TABLET | Freq: Four times a day (QID) | ORAL | Status: DC | PRN

## 2015-02-17 MED ORDER — ALBUTEROL SULFATE (2.5 MG/3ML) 0.083% IN NEBU
2.5000 mg | INHALATION_SOLUTION | Freq: Once | RESPIRATORY_TRACT | Status: AC
  Administered 2015-02-17: 2.5 mg via RESPIRATORY_TRACT

## 2015-02-17 MED ORDER — LIDOCAINE HCL (CARDIAC) 20 MG/ML IV SOLN
INTRAVENOUS | Status: DC | PRN
  Administered 2015-02-17: 50 mg via INTRAVENOUS

## 2015-02-17 MED ORDER — PHENYLEPHRINE HCL 10 MG/ML IJ SOLN
INTRAMUSCULAR | Status: DC | PRN
  Administered 2015-02-17: 80 ug via INTRAVENOUS
  Administered 2015-02-17: 120 ug via INTRAVENOUS

## 2015-02-17 MED ORDER — OXYCODONE-ACETAMINOPHEN 5-325 MG PO TABS: 1.0000 | ORAL_TABLET | Freq: Four times a day (QID) | ORAL | Status: DC | PRN

## 2015-02-17 MED ORDER — PROMETHAZINE HCL 25 MG/ML IJ SOLN: 6.2500 mg | INTRAMUSCULAR | Status: DC | PRN

## 2015-02-17 MED ORDER — METHOCARBAMOL 1000 MG/10ML IJ SOLN
500.0000 mg | INTRAVENOUS | Status: DC
  Administered 2015-02-17: 500 mg via INTRAVENOUS
  Filled 2015-02-17: qty 5

## 2015-02-17 MED ORDER — ONDANSETRON HCL 4 MG PO TABS: 4.0000 mg | ORAL_TABLET | Freq: Four times a day (QID) | ORAL | Status: DC | PRN

## 2015-02-17 MED ORDER — MIDAZOLAM HCL 5 MG/5ML IJ SOLN
INTRAMUSCULAR | Status: DC | PRN
  Administered 2015-02-17: 2 mg via INTRAVENOUS

## 2015-02-17 MED ORDER — GLYCOPYRROLATE 0.2 MG/ML IJ SOLN
INTRAMUSCULAR | Status: DC | PRN
  Administered 2015-02-17: .8 mg via INTRAVENOUS

## 2015-02-17 MED ORDER — ROCURONIUM BROMIDE 50 MG/5ML IV SOLN
INTRAVENOUS | Status: AC
  Filled 2015-02-17: qty 1

## 2015-02-17 MED ORDER — OXYCODONE HCL 5 MG PO TABS
5.0000 mg | ORAL_TABLET | Freq: Four times a day (QID) | ORAL | Status: DC | PRN
  Administered 2015-02-17 – 2015-02-18 (×4): 10 mg via ORAL
  Filled 2015-02-17 (×4): qty 2

## 2015-02-17 MED ORDER — PROPOFOL 10 MG/ML IV BOLUS
INTRAVENOUS | Status: DC | PRN
  Administered 2015-02-17: 200 mg via INTRAVENOUS

## 2015-02-17 MED ORDER — METOCLOPRAMIDE HCL 5 MG PO TABS: 5.0000 mg | ORAL_TABLET | Freq: Three times a day (TID) | ORAL | Status: DC | PRN

## 2015-02-17 MED ORDER — DEXTROSE 5 % IV SOLN: 500.0000 mg | Freq: Four times a day (QID) | INTRAVENOUS | Status: DC | PRN

## 2015-02-17 MED ORDER — ACETAMINOPHEN 325 MG PO TABS: 650.0000 mg | ORAL_TABLET | Freq: Four times a day (QID) | ORAL | Status: DC | PRN

## 2015-02-17 MED ORDER — METOCLOPRAMIDE HCL 5 MG/ML IJ SOLN: 5.0000 mg | Freq: Three times a day (TID) | INTRAMUSCULAR | Status: DC | PRN

## 2015-02-17 MED ORDER — HYDROMORPHONE HCL 1 MG/ML IJ SOLN
0.2500 mg | INTRAMUSCULAR | Status: DC | PRN
  Administered 2015-02-17 (×3): 0.5 mg via INTRAVENOUS

## 2015-02-17 MED ORDER — HYDROMORPHONE HCL 1 MG/ML IJ SOLN
0.5000 mg | INTRAMUSCULAR | Status: DC | PRN
  Administered 2015-02-17 – 2015-02-18 (×5): 0.5 mg via INTRAVENOUS
  Filled 2015-02-17 (×5): qty 1

## 2015-02-17 MED ORDER — EPHEDRINE SULFATE 50 MG/ML IJ SOLN
INTRAMUSCULAR | Status: AC
  Filled 2015-02-17: qty 1

## 2015-02-17 MED ORDER — LIDOCAINE HCL (CARDIAC) 20 MG/ML IV SOLN
INTRAVENOUS | Status: AC
  Filled 2015-02-17: qty 5

## 2015-02-17 MED ORDER — ONDANSETRON HCL 4 MG/2ML IJ SOLN
INTRAMUSCULAR | Status: AC
  Filled 2015-02-17: qty 2

## 2015-02-17 MED ORDER — CEFAZOLIN SODIUM 1-5 GM-% IV SOLN
1.0000 g | Freq: Three times a day (TID) | INTRAVENOUS | Status: AC
  Administered 2015-02-17: 1 g via INTRAVENOUS
  Filled 2015-02-17: qty 50

## 2015-02-17 MED ORDER — ONDANSETRON HCL 4 MG/2ML IJ SOLN
INTRAMUSCULAR | Status: DC | PRN
  Administered 2015-02-17: 4 mg via INTRAVENOUS

## 2015-02-17 SURGICAL SUPPLY — 60 items
BANDAGE ELASTIC 4 VELCRO ST LF (GAUZE/BANDAGES/DRESSINGS) ×2 IMPLANT
BANDAGE ELASTIC 6 VELCRO ST LF (GAUZE/BANDAGES/DRESSINGS) ×2 IMPLANT
BANDAGE ESMARK 6X9 LF (GAUZE/BANDAGES/DRESSINGS) IMPLANT
BIT DRILL 2.5X2.75 QC CALB (BIT) ×2 IMPLANT
BIT DRILL 2.9 CANN QC NONSTRL (BIT) ×2 IMPLANT
BNDG CMPR 9X6 STRL LF SNTH (GAUZE/BANDAGES/DRESSINGS)
BNDG ESMARK 6X9 LF (GAUZE/BANDAGES/DRESSINGS)
COVER MAYO STAND STRL (DRAPES) ×3 IMPLANT
COVER SURGICAL LIGHT HANDLE (MISCELLANEOUS) ×3 IMPLANT
CUFF TOURNIQUET SINGLE 34IN LL (TOURNIQUET CUFF) ×2 IMPLANT
CUFF TOURNIQUET SINGLE 44IN (TOURNIQUET CUFF) IMPLANT
DRAPE C-ARM 42X72 X-RAY (DRAPES) ×2 IMPLANT
DRAPE INCISE IOBAN 66X45 STRL (DRAPES) ×3 IMPLANT
DRAPE PROXIMA HALF (DRAPES) ×3 IMPLANT
DRAPE U-SHAPE 47X51 STRL (DRAPES) ×3 IMPLANT
DRSG PAD ABDOMINAL 8X10 ST (GAUZE/BANDAGES/DRESSINGS) ×3 IMPLANT
DURAPREP 26ML APPLICATOR (WOUND CARE) ×3 IMPLANT
ELECT REM PT RETURN 9FT ADLT (ELECTROSURGICAL) ×3
ELECTRODE REM PT RTRN 9FT ADLT (ELECTROSURGICAL) ×1 IMPLANT
GAUZE SPONGE 4X4 12PLY STRL (GAUZE/BANDAGES/DRESSINGS) ×3 IMPLANT
GAUZE XEROFORM 5X9 LF (GAUZE/BANDAGES/DRESSINGS) ×3 IMPLANT
GLOVE BIOGEL PI IND STRL 8 (GLOVE) ×2 IMPLANT
GLOVE BIOGEL PI INDICATOR 8 (GLOVE) ×4
GLOVE ORTHO TXT STRL SZ7.5 (GLOVE) ×6 IMPLANT
GOWN STRL REUS W/ TWL LRG LVL3 (GOWN DISPOSABLE) ×1 IMPLANT
GOWN STRL REUS W/ TWL XL LVL3 (GOWN DISPOSABLE) ×1 IMPLANT
GOWN STRL REUS W/TWL 2XL LVL3 (GOWN DISPOSABLE) ×3 IMPLANT
GOWN STRL REUS W/TWL LRG LVL3 (GOWN DISPOSABLE) ×3
GOWN STRL REUS W/TWL XL LVL3 (GOWN DISPOSABLE) ×3
K-WIRE ACE 1.6X6 (WIRE) ×6
KIT BASIN OR (CUSTOM PROCEDURE TRAY) ×3 IMPLANT
KIT ROOM TURNOVER OR (KITS) ×3 IMPLANT
KWIRE ACE 1.6X6 (WIRE) IMPLANT
MANIFOLD NEPTUNE II (INSTRUMENTS) ×3 IMPLANT
NS IRRIG 1000ML POUR BTL (IV SOLUTION) ×3 IMPLANT
PACK ORTHO EXTREMITY (CUSTOM PROCEDURE TRAY) ×3 IMPLANT
PAD ARMBOARD 7.5X6 YLW CONV (MISCELLANEOUS) ×6 IMPLANT
PAD CAST 4YDX4 CTTN HI CHSV (CAST SUPPLIES) ×1 IMPLANT
PADDING CAST COTTON 4X4 STRL (CAST SUPPLIES) ×3
PADDING CAST COTTON 6X4 STRL (CAST SUPPLIES) ×3 IMPLANT
PLATE ACE 100DEG 7HOLE (Plate) ×2 IMPLANT
SCREW ACE CAN 4.0 40M (Screw) ×4 IMPLANT
SCREW CORTICAL 3.5MM  12MM (Screw) ×2 IMPLANT
SCREW CORTICAL 3.5MM  28MM (Screw) ×2 IMPLANT
SCREW CORTICAL 3.5MM 12MM (Screw) IMPLANT
SCREW CORTICAL 3.5MM 14MM (Screw) ×6 IMPLANT
SCREW CORTICAL 3.5MM 28MM (Screw) IMPLANT
SCREW NLOCK CANC HEX 4X14 (Screw) ×4 IMPLANT
SPONGE LAP 18X18 X RAY DECT (DISPOSABLE) ×3 IMPLANT
STAPLER VISISTAT 35W (STAPLE) IMPLANT
SUCTION FRAZIER TIP 10 FR DISP (SUCTIONS) ×3 IMPLANT
SUT ETHILON 3 0 PS 1 (SUTURE) ×6 IMPLANT
SUT VIC AB 2-0 CT1 27 (SUTURE) ×6
SUT VIC AB 2-0 CT1 TAPERPNT 27 (SUTURE) ×2 IMPLANT
TOWEL OR 17X24 6PK STRL BLUE (TOWEL DISPOSABLE) ×3 IMPLANT
TOWEL OR 17X26 10 PK STRL BLUE (TOWEL DISPOSABLE) ×3 IMPLANT
TUBE CONNECTING 12'X1/4 (SUCTIONS) ×1
TUBE CONNECTING 12X1/4 (SUCTIONS) ×2 IMPLANT
WATER STERILE IRR 1000ML POUR (IV SOLUTION) ×3 IMPLANT
YANKAUER SUCT BULB TIP NO VENT (SUCTIONS) ×3 IMPLANT

## 2015-02-17 NOTE — Brief Op Note (Signed)
02/17/2015  10:39 AM  PATIENT:  Deanna Whitaker  49 y.o. female  PRE-OPERATIVE DIAGNOSIS:  Right Bimalleolar ankle fracture  POST-OPERATIVE DIAGNOSIS:  Right Bimalleolar ankle fracture  PROCEDURE:  Procedure(s): OPEN REDUCTION INTERNAL FIXATION (ORIF) RIGHT ANKLE FRACTURE (Right)  SURGEON:  Surgeon(s) and Role:    * Eldred Manges, MD - Primary  PHYSICIAN ASSISTANT: Jaja Switalski m. Barry Dienes    ANESTHESIA:   general  EBL:  Total I/O In: 1000 [I.V.:1000] Out: 25 [Blood:25]  BLOOD ADMINISTERED:none  DRAINS: none    SPECIMEN:  No Specimen  DISPOSITION OF SPECIMEN:  N/A  COUNTS:  YES  TOURNIQUET:   Total Tourniquet Time Documented: Thigh (Right) - 44 minutes Total: Thigh (Right) - 44 minutes    PATIENT DISPOSITION:  PACU - hemodynamically stable.

## 2015-02-17 NOTE — Anesthesia Preprocedure Evaluation (Signed)
Anesthesia Evaluation  Patient identified by MRN, date of birth, ID band Patient awake    Reviewed: Allergy & Precautions, NPO status , Patient's Chart, lab work & pertinent test results  Airway Mallampati: II  TM Distance: >3 FB Neck ROM: Full    Dental  (+) Teeth Intact, Dental Advisory Given   Pulmonary neg pulmonary ROS,    Pulmonary exam normal       Cardiovascular negative cardio ROS Normal cardiovascular exam    Neuro/Psych PSYCHIATRIC DISORDERS Anxiety Depression negative neurological ROS     GI/Hepatic negative GI ROS, Neg liver ROS,   Endo/Other  negative endocrine ROSMorbid obesity  Renal/GU negative Renal ROS  negative genitourinary   Musculoskeletal negative musculoskeletal ROS (+)   Abdominal   Peds negative pediatric ROS (+)  Hematology negative hematology ROS (+)   Anesthesia Other Findings   Reproductive/Obstetrics negative OB ROS                             Anesthesia Physical Anesthesia Plan  ASA: II  Anesthesia Plan: General   Post-op Pain Management:    Induction: Intravenous  Airway Management Planned: Oral ETT  Additional Equipment:   Intra-op Plan:   Post-operative Plan:   Informed Consent: I have reviewed the patients History and Physical, chart, labs and discussed the procedure including the risks, benefits and alternatives for the proposed anesthesia with the patient or authorized representative who has indicated his/her understanding and acceptance.   Dental advisory given  Plan Discussed with: CRNA, Anesthesiologist and Surgeon  Anesthesia Plan Comments:         Anesthesia Quick Evaluation

## 2015-02-17 NOTE — Care Management Utilization Note (Signed)
Utilization review completed by Jamilya Sarrazin N. Bridgid Printz, RN BSN 

## 2015-02-17 NOTE — Anesthesia Postprocedure Evaluation (Signed)
Anesthesia Post Note  Patient: Deanna Whitaker  Procedure(s) Performed: Procedure(s) (LRB): OPEN REDUCTION INTERNAL FIXATION (ORIF) RIGHT ANKLE FRACTURE (Right)  Anesthesia type: general  Patient location: PACU  Post pain: Pain level controlled  Post assessment: Patient's Cardiovascular Status Stable  Last Vitals:  Filed Vitals:   02/17/15 0945  BP:   Pulse: 79  Temp:   Resp: 12    Post vital signs: Reviewed and stable  Level of consciousness: sedated  Complications: No apparent anesthesia complications

## 2015-02-17 NOTE — Op Note (Signed)
[name], Deanna Whitaker            ACCOUNT NO.:  [id number]  MEDICAL RECORD NO.:  [id number]  LOCATION:  5N23C                        FACILITY:  [facility]  PHYSICIAN:  Shanautica Forker C. Ophelia Charter, M.D.    DATE OF BIRTH:  1966-05-02  DATE OF PROCEDURE:  02/17/2015 DATE OF DISCHARGE:  02/18/2015                              OPERATIVE REPORT   PREOPERATIVE DIAGNOSIS:  Bimalleolar ankle fracture dislocation, right ankle.  POSTOPERATIVE DIAGNOSIS:  Bimalleolar ankle fracture dislocation, right ankle.  PROCEDURE:  Open reduction and internal fixation, right bimalleolar ankle fracture.  SURGEON:  Anusha Claus C. Ophelia Charter, M.D.  ANESTHESIA:  General.  IMPLANTS:  Titanium 1/3rd tubular Biomet plate.  Cannulated medial 40 mm cancellous lag screws.  COMPLICATIONS:  None.  TOURNIQUET TIME:  44 minutes x350.  INDICATIONS FOR PROCEDURE:  This 49 year old female suffered an injury with unstable bimalleolar right ankle fracture.  This was a closed injury after falling.  She is brought in for reduction and stabilization.  DESCRIPTION OF PROCEDURE:  After induction of general anesthesia, Ancef prophylaxis with the patient's MSSA history, DuraPrep was used, Ancef was given prophylactically.  Time-out procedure completed after draping with stockinette, folded towel, towel clip, extremity sheets, and drapes.  The leg was wrapped in Esmarch, tourniquet inflated.  Medial incision was made C-shaped, fracture hematoma was evacuated.  There was nothing preventing reduction on the medial side.  Attention was turned to the lateral side.  Femur was exposed periosteally.  Reduced, held with a self-retaining clamp.  A 7-hole 1/3rd tubular clamped plate was applied, this was titanium, and C-arm was used to check position and make sure it was exactly far enough distal.  Proximal 3 screws were placed.  14 mm fully-threaded cancellous screws were placed distally, unicortically.  Lag screw was drilled from anterior proximal  to posterior distal, which was a 28 mm screw, it was seen exiting out the posterior cortex with a good bite and anatomic reduction as blood was squeezed out of the reduced fracture site.  C-arm pictures were taken. After this, one final screw was placed just above the fracture, which was again bicortical 14 mm.  Attention was turned to the medial side, fracture was reduced, held with a towel clip, 2 guide pins were placed, reamed, and then 40 mm cancellous threaded screws were placed.  Final spot pictures were taken.  There was good position, alignment, AP lateral mortise syndesmosis was normal.  Medial clear space was closed. Both wounds were irrigated, tourniquet deflated after 44 minutes.  There was good hemostasis.  Bovie had been used intermittently on small subcutaneous bleeders.  Subcu reapproximated with 2-0 Vicryl.  Skin staple closure. Postop dressing with short-leg splint after Xeroform, 4x4s, ABDs, Webril, fiberglass splint and more Webril followed by Ace wrap. Instrument count and needle count was correct.     Liliah Dorian C. Ophelia Charter, M.D.     MCY/MEDQ  D:  02/17/2015  T:  02/17/2015  Job:  [id number]

## 2015-02-17 NOTE — Brief Op Note (Signed)
02/17/2015  9:08 AM  PATIENT:  Deanna Whitaker  49 y.o. female  PRE-OPERATIVE DIAGNOSIS:  Right Bimalleolar ankle fracture  POST-OPERATIVE DIAGNOSIS:  Right Bimalleolar ankle fracture  PROCEDURE:  Procedure(s): OPEN REDUCTION INTERNAL FIXATION (ORIF) RIGHT ANKLE FRACTURE (Right)  SURGEON:  Surgeon(s) and Role:    * Eldred Manges, MD - Primary  PHYSICIAN ASSISTANT:   ASSISTANTS: none   ANESTHESIA:   local, regional and general  EBL:  Total I/O In: 1000 [I.V.:1000] Out: 25 [Blood:25]  BLOOD ADMINISTERED:none  DRAINS: none   LOCAL MEDICATIONS USED:  MARCAINE     SPECIMEN:  No Specimen  DISPOSITION OF SPECIMEN:  N/A  COUNTS:  YES  TOURNIQUET:  * Missing tourniquet times found for documented tourniquets in log:  [id number] *  DICTATION: .Other Dictation: Dictation Number [id number]  PLAN OF CARE: Admit for overnight observation  PATIENT DISPOSITION:  PACU - hemodynamically stable.   Delay start of Pharmacological VTE agent (>24hrs) due to surgical blood loss or risk of bleeding: yes

## 2015-02-17 NOTE — Interval H&P Note (Signed)
History and Physical Interval Note:  02/17/2015 7:23 AM  Deanna Whitaker  has presented today for surgery, with the diagnosis of Right Bimalleolar ankle fracture  The various methods of treatment have been discussed with the patient and family. After consideration of risks, benefits and other options for treatment, the patient has consented to  Procedure(s): OPEN REDUCTION INTERNAL FIXATION (ORIF) RIGHT ANKLE FRACTURE (Right) as a surgical intervention .  The patient's history has been reviewed, patient examined, no change in status, stable for surgery.  I have reviewed the patient's chart and labs.  Questions were answered to the patient's satisfaction.     Marchell Froman C

## 2015-02-17 NOTE — Transfer of Care (Signed)
Immediate Anesthesia Transfer of Care Note  Patient: Deanna Whitaker  Procedure(s) Performed: Procedure(s): OPEN REDUCTION INTERNAL FIXATION (ORIF) RIGHT ANKLE FRACTURE (Right)  Patient Location: PACU  Anesthesia Type:General  Level of Consciousness: awake, alert  and oriented  Airway & Oxygen Therapy: Patient Spontanous Breathing and Patient connected to nasal cannula oxygen  Post-op Assessment: Report given to RN and Post -op Vital signs reviewed and stable  Post vital signs: Reviewed and stable  Last Vitals:  Filed Vitals:   02/17/15 0911  BP:   Pulse:   Temp: 36.4 C  Resp:     Complications: No apparent anesthesia complications

## 2015-02-17 NOTE — H&P (Signed)
Deanna Whitaker is an 49 y.o. female.   Patient fell 13 February 2015 down 10 stairs suffering injury to the right ankle.  See in the ED and xrays showed a displaced bimalleolar ankle fracture.  Splinted and was referred to our office to treatment options.  Past Medical History  Diagnosis Date  . Anxiety   . Depression     Past Surgical History  Procedure Laterality Date  . Abdominal hysterectomy      History reviewed. No pertinent family history. Social History:  reports that she has never smoked. She has never used smokeless tobacco. She reports that she drinks alcohol. She reports that she does not use illicit drugs.  Allergies: No Known Allergies  Medications Prior to Admission  Medication Sig Dispense Refill  . oxyCODONE-acetaminophen (PERCOCET/ROXICET) 5-325 MG per tablet Take 1-2 tablets by mouth every 6 (six) hours as needed for severe pain. 20 tablet 0  . clonazePAM (KLONOPIN) 1 MG tablet 1 tablet po qhs prn sleep. 1/2 tablet po during the day prn anxiety (Patient not taking: Reported on 09/21/2014) 30 tablet 2    Results for orders placed or performed during the hospital encounter of 02/16/15 (from the past 48 hour(s))  Surgical pcr screen     Status: Abnormal   Collection Time: 02/16/15 10:23 AM  Result Value Ref Range   MRSA, PCR NEGATIVE NEGATIVE   Staphylococcus aureus POSITIVE (A) NEGATIVE    Comment:        The Xpert SA Assay (FDA approved for NASAL specimens in patients over 80 years of age), is one component of a comprehensive surveillance program.  Test performance has been validated by [facility] for patients greater than or equal to 39 year old. It is not intended to diagnose infection nor to guide or monitor treatment.   CBC     Status: Abnormal   Collection Time: 02/16/15 10:24 AM  Result Value Ref Range   WBC 12.7 (H) 4.0 - 10.5 K/uL   RBC 4.74 3.87 - 5.11 MIL/uL   Hemoglobin 15.0 12.0 - 15.0 g/dL   HCT 43.0 36.0 - 46.0 %   MCV 90.7 78.0 -  100.0 fL   MCH 31.6 26.0 - 34.0 pg   MCHC 34.9 30.0 - 36.0 g/dL   RDW 12.7 11.5 - 15.5 %   Platelets 244 150 - 400 K/uL  Comprehensive metabolic panel     Status: Abnormal   Collection Time: 02/16/15 10:24 AM  Result Value Ref Range   Sodium 136 135 - 145 mmol/L   Potassium 3.5 3.5 - 5.1 mmol/L   Chloride 101 101 - 111 mmol/L   CO2 25 22 - 32 mmol/L   Glucose, Bld 120 (H) 65 - 99 mg/dL   BUN 7 6 - 20 mg/dL   Creatinine, Ser 0.82 0.44 - 1.00 mg/dL   Calcium 8.9 8.9 - 10.3 mg/dL   Total Protein 7.3 6.5 - 8.1 g/dL   Albumin 3.6 3.5 - 5.0 g/dL   AST 39 15 - 41 U/L   ALT 42 14 - 54 U/L   Alkaline Phosphatase 60 38 - 126 U/L   Total Bilirubin 1.0 0.3 - 1.2 mg/dL   GFR calc non Af Amer >60 >60 mL/min   GFR calc Af Amer >60 >60 mL/min    Comment: (NOTE) The eGFR has been calculated using the CKD EPI equation. This calculation has not been validated in all clinical situations. eGFR's persistently <60 mL/min signify possible Chronic Kidney Disease.  Anion gap 10 5 - 15  Protime-INR     Status: None   Collection Time: 02/16/15 10:24 AM  Result Value Ref Range   Prothrombin Time 14.4 11.6 - 15.2 seconds   INR 1.10 0.00 - 1.49  Urinalysis, Routine w reflex microscopic (not at [facility])     Status: Abnormal   Collection Time: 02/16/15 10:24 AM  Result Value Ref Range   Color, Urine AMBER (A) YELLOW    Comment: BIOCHEMICALS MAY BE AFFECTED BY COLOR   APPearance CLOUDY (A) CLEAR   Specific Gravity, Urine 1.027 1.005 - 1.030   pH 5.5 5.0 - 8.0   Glucose, UA NEGATIVE NEGATIVE mg/dL   Hgb urine dipstick NEGATIVE NEGATIVE   Bilirubin Urine SMALL (A) NEGATIVE   Ketones, ur 40 (A) NEGATIVE mg/dL   Protein, ur NEGATIVE NEGATIVE mg/dL   Urobilinogen, UA 1.0 0.0 - 1.0 mg/dL   Nitrite NEGATIVE NEGATIVE   Leukocytes, UA NEGATIVE NEGATIVE    Comment: MICROSCOPIC NOT DONE ON URINES WITH NEGATIVE PROTEIN, BLOOD, LEUKOCYTES, NITRITE, OR GLUCOSE <1000 mg/dL.   Dg Chest 2 View  02/16/2015    CLINICAL DATA:  Pre-admission study prior to ankle fracture reduction  EXAM: CHEST  2 VIEW  COMPARISON:  Chest x-ray dated November 10, 2009  FINDINGS: The lungs are reasonably well inflated and clear. The cardiac silhouette is mildly enlarged but stable. The pulmonary vascularity is mildly prominentbut has improved overall since the previous study. There is mild tortuosity of the descending thoracic aorta. The bony thorax is unremarkable.  IMPRESSION: 1. Enlargement of the cardiac silhouette with mild central pulmonary vascular problem prominence. There is no pulmonary edema. These findings have improved since the previous study. Is there a history of CHF? 2. There is no pneumonia nor pleural effusion.   Electronically Signed   By: David  Martinique M.D.   On: 02/16/2015 11:02    Review of Systems  Constitutional: Negative.   HENT: Negative.   Respiratory: Negative.   Cardiovascular: Negative for chest pain and orthopnea.  Gastrointestinal: Negative.   Musculoskeletal: Positive for joint pain.  Psychiatric/Behavioral: Negative.     Blood pressure 141/83, pulse 79, temperature 98.8 F (37.1 C), temperature source Oral, resp. rate 16, height _0  (1.6 m), weight 102.059 kg (225 lb), SpO2 100 %. Physical Exam  Constitutional: She is oriented to person, place, and time. She appears well-developed and well-nourished.  HENT:  Head: Normocephalic and atraumatic.  Eyes: EOM are normal. Pupils are equal, round, and reactive to light.  Neck: Normal range of motion.  Respiratory: Effort normal.  Musculoskeletal:  Splint right LE intact. Moves toes well.  Sensation intact.    Neurological: She is alert and oriented to person, place, and time.  Skin: Skin is warm.  Psychiatric: She has a normal mood and affect.     Assessment/Plan Right bimalleolar ankle fracture. Will proceed with ORIF as scheduled.  Surgical procedure along with possible complications, rehab/recovery time discussed.  All questions  answered.    Brighton Pilley M 02/17/2015, 6:09 AM

## 2015-02-17 NOTE — Anesthesia Procedure Notes (Addendum)
Procedure Name: Intubation Performed by: Kizzie Fantasia Pre-anesthesia Checklist: Patient identified, Emergency Drugs available, Suction available, Timeout performed and Patient being monitored Patient Re-evaluated:Patient Re-evaluated prior to inductionOxygen Delivery Method: Circle system utilized Preoxygenation: Pre-oxygenation with 100% oxygen Intubation Type: IV induction Ventilation: Mask ventilation without difficulty Laryngoscope Size: Mac and 3 Grade View: Grade I Tube type: Oral Tube size: 7.0 mm Number of attempts: 1 Airway Equipment and Method: Stylet Placement Confirmation: ETT inserted through vocal cords under direct vision,  positive ETCO2,  CO2 detector and breath sounds checked- equal and bilateral Secured at: 21 cm Tube secured with: Tape Dental Injury: Teeth and Oropharynx as per pre-operative assessment    Anesthesia Regional Block:  Popliteal block  Pre-Anesthetic Checklist: ,, timeout performed, Correct Patient, Correct Site, Correct Laterality, Correct Procedure, Correct Position, site marked, Risks and benefits discussed,  Surgical consent,  Pre-op evaluation,  At surgeon's request and post-op pain management  Laterality: Right  Prep: chloraprep       Needles:  Injection technique: Single-shot  Needle Type: Echogenic Stimulator Needle          Additional Needles:  Procedures: ultrasound guided (picture in chart) and nerve stimulator Popliteal block  Nerve Stimulator or Paresthesia:  Response: plantar flexion, 0.45 mA,   Additional Responses:   Narrative:  Start time: 02/17/2015 7:12 AM End time: 02/17/2015 7:01 AM Injection made incrementally with aspirations every 5 mL.  Performed by: Personally  Anesthesiologist: Heather Roberts  Additional Notes: A functioning IV was confirmed and monitors were applied.  Sterile prep and drape, hand hygiene and sterile gloves were used.  Negative aspiration and test dose prior to incremental  administration of local anesthetic. The patient tolerated the procedure well.Ultrasound  guidance: relevant anatomy identified, needle position confirmed, local anesthetic spread visualized around nerve(s), vascular puncture avoided.  Image printed for medical record.

## 2015-02-17 NOTE — Evaluation (Signed)
Physical Therapy Evaluation Patient Details Name: Deanna Whitaker MRN: [id number] DOB: Aug 23, 1966 Today's Date: 02/17/2015   History of Present Illness  Patient is a 49 y/o female s/p ORIF right bimalleolar fx. PMH includes anxiety and depression.   Clinical Impression  Patient presents with pain, weakness and post surgical deficits RLE s/p above surgery impacting mobility. Tolerated short distance ambulation with use of RW for support. Pt reports balance is improved with use of RW vs crutches as pt fell backwards using crutches at home. Pt compliant with NWB status throughout mobility. Education provided on edema management and exercises. Would benefit from 1 additional PT follow up to perform stair training in order to prepare pt for discharge home tomorrow.     Follow Up Recommendations Supervision/Assistance - 24 hour;No PT follow up    Equipment Recommendations  Rolling walker with 5" wheels    Recommendations for Other Services OT consult     Precautions / Restrictions Restrictions Weight Bearing Restrictions: Yes RLE Weight Bearing: Non weight bearing      Mobility  Bed Mobility Overal bed mobility: Modified Independent                Transfers Overall transfer level: Needs assistance Equipment used: Rolling walker (2 wheeled) Transfers: Sit to/from Stand Sit to Stand: Min assist         General transfer comment: MIn A to rise from EOB. Good demo of hand placement. Stood from [employer], from toilet x1 using grab bar for support.   Ambulation/Gait Ambulation/Gait assistance: Min guard Ambulation Distance (Feet): 15 Feet (x2 bouts) Assistive device: Rolling walker (2 wheeled) Gait Pattern/deviations: Step-to pattern   Gait velocity interpretation: Below normal speed for age/gender General Gait Details: "hop to" gait pattern. Compliant with NWB RLE.  Stairs            Wheelchair Mobility    Modified Rankin (Stroke Patients Only)       Balance  Overall balance assessment: Needs assistance Sitting-balance support: Feet supported;No upper extremity supported Sitting balance-Leahy Scale: Good     Standing balance support: During functional activity Standing balance-Leahy Scale: Fair                               Pertinent Vitals/Pain Pain Assessment: 0-10 Pain Score: 5  Pain Location: RLE Pain Descriptors / Indicators: Sore;Aching;Throbbing Pain Intervention(s): Monitored during session;Repositioned;Premedicated before session    Home Living Family/patient expects to be discharged to:: Private residence Living Arrangements: Children Available Help at Discharge: Family;Available 24 hours/day Type of Home: House Home Access: Stairs to enter Entrance Stairs-Rails: None Entrance Stairs-Number of Steps: 1 Home Layout: One level Home Equipment: Crutches      Prior Function Level of Independence: Independent;Independent with assistive device(s)         Comments: Pt using crutches since fall down the stairs June 6. Prior to this, independent.     Hand Dominance        Extremity/Trunk Assessment   Upper Extremity Assessment: Defer to OT evaluation           Lower Extremity Assessment: RLE deficits/detail RLE Deficits / Details: Limited SLR. Able to wiggle toes and perform LAQ.       Communication   Communication: No difficulties  Cognition Arousal/Alertness: Awake/alert Behavior During Therapy: WFL for tasks assessed/performed Overall Cognitive Status: Within Functional Limits for tasks assessed  General Comments General comments (skin integrity, edema, etc.): Family present in room during PT session.    Exercises General Exercises - Lower Extremity Quad Sets: Right;10 reps;Seated Gluteal Sets: Both;10 reps;Seated Long Arc Quad: Right;5 reps;Seated      Assessment/Plan    PT Assessment Patient needs continued PT services  PT Diagnosis Generalized  weakness;Acute pain   PT Problem List Decreased strength;Pain;Decreased activity tolerance;Decreased balance;Decreased mobility  PT Treatment Interventions Balance training;Gait training;Stair training;Functional mobility training;Therapeutic activities;Therapeutic exercise;Patient/family education;DME instruction   PT Goals (Current goals can be found in the Care Plan section) Acute Rehab PT Goals Patient Stated Goal: to get back to life PT Goal Formulation: With patient Time For Goal Achievement: 03/03/15 Potential to Achieve Goals: Good    Frequency Min 3X/week   Barriers to discharge        Co-evaluation               End of Session Equipment Utilized During Treatment: Gait belt Activity Tolerance: Patient tolerated treatment well Patient left: in chair;with call bell/phone within reach;with family/visitor present Nurse Communication: Mobility status         Time: 4627-0350 PT Time Calculation (min) (ACUTE ONLY): 24 min   Charges:   PT Evaluation $Initial PT Evaluation Tier I: 1 Procedure PT Treatments $Gait Training: 8-22 mins   PT G Codes:        Dwight Burdo A Olena Willy 02/17/2015, 4:01 PM  Mylo Red, PT, DPT [phone]

## 2015-02-18 DIAGNOSIS — S82899A Other fracture of unspecified lower leg, initial encounter for closed fracture: Secondary | ICD-10-CM | POA: Diagnosis present

## 2015-02-18 DIAGNOSIS — S82841A Displaced bimalleolar fracture of right lower leg, initial encounter for closed fracture: Secondary | ICD-10-CM | POA: Diagnosis not present

## 2015-02-18 NOTE — Care Management Note (Signed)
Case Management Note  Patient Details  Name: Deanna Whitaker MRN: [id number] Date of Birth: 1966-06-19  Subjective/Objective:                    Action/Plan:   Expected Discharge Date:                  Expected Discharge Plan:  Home/Self Care  In-House Referral:     Discharge planning Services  CM Consult  Post Acute Care Choice:    Choice offered to:     DME Arranged:  Walker rolling, 3-N-1 DME Agency:  [facility]  HH Arranged:    [facility] Agency:     Status of Service:  Completed, signed off  Medicare Important Message Given:    Date Medicare IM Given:    Medicare IM give by:    Date Additional Medicare IM Given:    Additional Medicare Important Message give by:     If discussed at Long Length of Stay Meetings, dates discussed:    Additional Comments:   CM spoke with patient at the bedside. Needs a 3N1 and rolling walker. [facility] at [facility] notified of DME request and will deliver to patient's room. Two sons live with her, one works various hours and the other is 49 years old. She asked about having someone for assistance with ambulating in her home. States she was told SW could assist with having someone in her home. She does not want to go to a facility for rehab. Plans to discuss home care with her physician.   Antony Haste, RN 02/18/2015, 10:20 AM

## 2015-02-18 NOTE — Progress Notes (Signed)
Physical Therapy Treatment Patient Details Name: Deanna Whitaker MRN: [id number] DOB: November 24, 1965 Today's Date: 02/18/2015    History of Present Illness Patient is a 49 y/o female s/p ORIF right bimalleolar fx. PMH includes anxiety and depression.     PT Comments    Patientprogressing well. Able to complete steps. Patient safe to D/C from a mobility standpoint based on progression towards goals set on PT eval.    Follow Up Recommendations  Supervision/Assistance - 24 hour;No PT follow up     Equipment Recommendations  Rolling walker with 5" wheels    Recommendations for Other Services       Precautions / Restrictions Restrictions RLE Weight Bearing: Non weight bearing    Mobility  Bed Mobility Overal bed mobility: Modified Independent                Transfers Overall transfer level: Modified independent                  Ambulation/Gait Ambulation/Gait assistance: Supervision Ambulation Distance (Feet): 40 Feet Assistive device: Rolling walker (2 wheeled) Gait Pattern/deviations: Step-to pattern     General Gait Details: "hop to" gait pattern. Compliant with NWB RLE.   Stairs Stairs: Yes Stairs assistance: Min guard Stair Management: Step to pattern;With walker Number of Stairs: 1 General stair comments: Cues for technique. MG for safety  Wheelchair Mobility    Modified Rankin (Stroke Patients Only)       Balance                                    Cognition Arousal/Alertness: Awake/alert Behavior During Therapy: WFL for tasks assessed/performed Overall Cognitive Status: Within Functional Limits for tasks assessed                      Exercises      General Comments        Pertinent Vitals/Pain Pain Score: 5  Pain Location: R LE Pain Descriptors / Indicators: Aching;Throbbing Pain Intervention(s): Monitored during session;Repositioned    Home Living                      Prior Function             PT Goals (current goals can now be found in the care plan section) Progress towards PT goals: Progressing toward goals    Frequency  Min 3X/week    PT Plan Current plan remains appropriate    Co-evaluation             End of Session Equipment Utilized During Treatment: Gait belt Activity Tolerance: Patient tolerated treatment well Patient left: in chair;with call bell/phone within reach     Time: 0223-3612 PT Time Calculation (min) (ACUTE ONLY): 12 min  Charges:  $Gait Training: 8-22 mins                    G Codes:      Fredrich Birks 02/18/2015, 12:21 PM 02/18/2015 Fredrich Birks PTA [phone] pager [phone] office

## 2015-02-18 NOTE — Progress Notes (Signed)
Patient stable Toes perfused mobile and sensate on the operative side with the ankle fracture Plan discharge today after therapy and social work consult

## 2015-02-20 ENCOUNTER — Encounter (HOSPITAL_COMMUNITY): Payer: Self-pay | Admitting: Orthopaedic Surgery

## 2015-02-20 NOTE — Progress Notes (Signed)
PT eval addendum- added G-codes    Mar 10, 2015 1605  PT G-Codes **NOT FOR INPATIENT CLASS**  Functional Assessment Tool Used clinical judgment  Functional Limitation Mobility: Walking and moving around  Mobility: Walking and Moving Around Current Status (F8101) CJ  Mobility: Walking and Moving Around Goal Status (B5102) CI    Mylo Red, PT, DPT [phone]

## 2015-03-22 NOTE — Discharge Summary (Signed)
Patient ID: Deanna Whitaker MRN: [id number] DOB/AGE: [birth year] 49 y.o.  Admit date: 02/17/2015 Discharge date: 03/22/2015  Admission Diagnoses:  Active Problems:   Bimalleolar fracture of right ankle   Ankle fracture, bimalleolar, closed   Ankle fracture   Discharge Diagnoses:  Active Problems:   Bimalleolar fracture of right ankle   Ankle fracture, bimalleolar, closed   Ankle fracture  status post Procedure(s): OPEN REDUCTION INTERNAL FIXATION (ORIF) RIGHT ANKLE FRACTURE  Past Medical History  Diagnosis Date  . Anxiety   . Depression     Surgeries: Procedure(s): OPEN REDUCTION INTERNAL FIXATION (ORIF) RIGHT ANKLE FRACTURE on 02/17/2015   Consultants:    Discharged Condition: Improved  Hospital Course: Deanna Whitaker is an 49 y.o. female who was admitted 02/17/2015 for operative treatment of ankle fracture. Patient failed conservative treatments (please see the history and physical for the specifics) and had severe unremitting pain that affects sleep, daily activities and work/hobbies. After pre-op clearance, the patient was taken to the operating room on 02/17/2015 and underwent  Procedure(s): OPEN REDUCTION INTERNAL FIXATION (ORIF) RIGHT ANKLE FRACTURE.    Patient was given perioperative antibiotics:  Anti-infectives    Start     Dose/Rate Route Frequency Ordered Stop   02/17/15 1400  ceFAZolin (ANCEF) IVPB 1 g/50 mL premix     1 g 100 mL/hr over 30 Minutes Intravenous 3 times per day 02/17/15 1140 02/17/15 1729   02/17/15 0645  ceFAZolin (ANCEF) IVPB 2 g/50 mL premix     2 g 100 mL/hr over 30 Minutes Intravenous To Surgery 02/16/15 1329 02/17/15 0813       Patient was given sequential compression devices and early ambulation to prevent DVT.   Patient benefited maximally from hospital stay and there were no complications. At the time of discharge, the patient was urinating/moving their bowels without difficulty, tolerating a regular diet, pain is  controlled with oral pain medications and they have been cleared by PT/OT.   Recent vital signs: No data found.    Recent laboratory studies: No results for input(s): WBC, HGB, HCT, PLT, NA, K, CL, CO2, BUN, CREATININE, GLUCOSE, INR, CALCIUM in the last 72 hours.  Invalid input(s): PT, 2   Discharge Medications:     Medication List    TAKE these medications        clonazePAM 1 MG tablet  Commonly known as:  KLONOPIN  1 tablet po qhs prn sleep. 1/2 tablet po during the day prn anxiety     methocarbamol 500 MG tablet  Commonly known as:  ROBAXIN  Take 1 tablet (500 mg total) by mouth every 6 (six) hours as needed for muscle spasms.     oxyCODONE-acetaminophen 5-325 MG per tablet  Commonly known as:  ROXICET  Take 1-2 tablets by mouth every 6 (six) hours as needed for severe pain.        Diagnostic Studies: No results found.      Discharge Instructions    Call MD / Call 911    Complete by:  As directed   If you experience chest pain or shortness of breath, CALL 911 and be transported to the hospital emergency room.  If you develope a fever above 101 F, pus (white drainage) or increased drainage or redness at the wound, or calf pain, call your surgeon's office.     Call MD / Call 911    Complete by:  As directed   If you experience chest pain or shortness of breath, CALL  911 and be transported to the hospital emergency room.  If you develope a fever above 101 F, pus (white drainage) or increased drainage or redness at the wound, or calf pain, call your surgeon's office.     Constipation Prevention    Complete by:  As directed   Drink plenty of fluids.  Prune juice may be helpful.  You may use a stool softener, such as Colace (over the counter) 100 mg twice a day.  Use MiraLax (over the counter) for constipation as needed.     Constipation Prevention    Complete by:  As directed   Drink plenty of fluids.  Prune juice may be helpful.  You may use a stool softener, such as  Colace (over the counter) 100 mg twice a day.  Use MiraLax (over the counter) for constipation as needed.     Diet - low sodium heart healthy    Complete by:  As directed      Diet - low sodium heart healthy    Complete by:  As directed      Discharge instructions    Complete by:  As directed   Do not remove splint/dressing or get wet!  Do not shower.  Strict non-weightbearing right foot.  Elevate foot above heart level as frequent as possible to decrease pain and swelling.  Ambulate with crutches or walker.     Driving restrictions    Complete by:  As directed   No driving     Increase activity slowly as tolerated    Complete by:  As directed      Increase activity slowly as tolerated    Complete by:  As directed      Lifting restrictions    Complete by:  As directed   No lifting           Follow-up Information    Schedule an appointment as soon as possible for a visit with Deanna MangesYATES,MARK C, MD.   Specialty:  Orthopedic Surgery   Why:  need return office visit one week.     Contact information:   [address] [place] [address] [phone]       Discharge Plan:  discharge to home  Disposition:     Signed: Naida SleightOWENS,Deanna Whitaker  03/22/2015, 7:59 AM

## 2015-06-26 ENCOUNTER — Encounter: Payer: Self-pay | Admitting: Family Medicine

## 2015-06-26 ENCOUNTER — Ambulatory Visit (INDEPENDENT_AMBULATORY_CARE_PROVIDER_SITE_OTHER): Payer: Managed Care, Other (non HMO) | Admitting: Family Medicine

## 2015-06-26 VITALS — BP 122/90 | Ht 63.0 in | Wt 218.0 lb

## 2015-06-26 DIAGNOSIS — M25571 Pain in right ankle and joints of right foot: Secondary | ICD-10-CM

## 2015-06-26 NOTE — Progress Notes (Signed)
   Subjective:    Patient ID: Deanna McgillWanda E Whitaker, female    DOB: 04/01/1966, 49 y.o.   MRN: [id number]  [facility] Needs referral to go back to surgeon. See Dr. Ophelia CharterYates at [facility] [place]. Had upcoming appt but was canceled bc of insurance change.    Last wk was called back for f u to assess ankle  Still swollen and uncomfortable   Pt has duel coverage  Pt has alreay had flu shot   Pt states no other concerns today.   Day numb [id number]   Review of Systems No headache no chest pain no back pain no abdominal pain    Objective:   Physical Exam  Alert vitals stable. HEENT normal lungs clear heart rare rhythm right ankle some edema pain with flexion      Assessment & Plan:  Impression status post ankle fracture with need to get back to her orthopedic surgeon who is managing plan referral

## 2015-11-16 ENCOUNTER — Telehealth: Payer: Self-pay | Admitting: Family Medicine

## 2015-11-16 DIAGNOSIS — Z139 Encounter for screening, unspecified: Secondary | ICD-10-CM

## 2015-11-16 DIAGNOSIS — R739 Hyperglycemia, unspecified: Secondary | ICD-10-CM

## 2015-11-16 NOTE — Telephone Encounter (Signed)
Pt is requesting lab orders to be sent over for an upcoming wellness visit. There are no previous labs ordered by our office in Epic.

## 2015-11-17 NOTE — Telephone Encounter (Signed)
Spoke with patient and informed her per Dr.Scott Luking-Labs for upcoming physical was ordered. Needs to be fasting prior to labs. Patient verbalized understanding.

## 2015-11-17 NOTE — Telephone Encounter (Signed)
I recommend lipid, liver, metabolic 7, hemoglobin A1c-screening, hyperglycemia

## 2015-11-27 LAB — HEPATIC FUNCTION PANEL
ALT: 26 IU/L (ref 0–32)
AST: 21 IU/L (ref 0–40)
Albumin: 4.2 g/dL (ref 3.5–5.5)
Alkaline Phosphatase: 62 IU/L (ref 39–117)
Bilirubin Total: 0.4 mg/dL (ref 0.0–1.2)
Bilirubin, Direct: 0.1 mg/dL (ref 0.00–0.40)
Total Protein: 6.7 g/dL (ref 6.0–8.5)

## 2015-11-28 LAB — LIPID PANEL
CHOL/HDL RATIO: 3.2 ratio (ref 0.0–4.4)
CHOLESTEROL TOTAL: 171 mg/dL (ref 100–199)
HDL: 53 mg/dL (ref >39)
LDL Calculated: 92 mg/dL (ref 0–99)
Triglycerides: 129 mg/dL (ref 0–149)
VLDL Cholesterol Cal: 26 mg/dL (ref 5–40)

## 2015-11-28 LAB — BASIC METABOLIC PANEL
BUN/Creatinine Ratio: 13 (ref 9–23)
BUN: 9 mg/dL (ref 6–24)
CALCIUM: 8.6 mg/dL — AB (ref 8.7–10.2)
CHLORIDE: 99 mmol/L (ref 96–106)
CO2: 26 mmol/L (ref 18–29)
Creatinine, Ser: 0.7 mg/dL (ref 0.57–1.00)
GFR calc Af Amer: 118 mL/min/{1.73_m2} (ref >59)
GFR, EST NON AFRICAN AMERICAN: 102 mL/min/{1.73_m2} (ref >59)
Glucose: 105 mg/dL — ABNORMAL HIGH (ref 65–99)
POTASSIUM: 3.9 mmol/L (ref 3.5–5.2)
Sodium: 140 mmol/L (ref 134–144)

## 2015-11-28 LAB — HEMOGLOBIN A1C
ESTIMATED AVERAGE GLUCOSE: 134 mg/dL
Hgb A1c MFr Bld: 6.3 % — ABNORMAL HIGH (ref 4.8–5.6)

## 2015-11-29 ENCOUNTER — Encounter: Payer: Self-pay | Admitting: Family Medicine

## 2015-11-29 ENCOUNTER — Ambulatory Visit (INDEPENDENT_AMBULATORY_CARE_PROVIDER_SITE_OTHER): Payer: Managed Care, Other (non HMO) | Admitting: Family Medicine

## 2015-11-29 VITALS — BP 130/84 | Ht 63.0 in | Wt 233.0 lb

## 2015-11-29 DIAGNOSIS — I4510 Unspecified right bundle-branch block: Secondary | ICD-10-CM

## 2015-11-29 DIAGNOSIS — H6120 Impacted cerumen, unspecified ear: Secondary | ICD-10-CM

## 2015-11-29 DIAGNOSIS — H918X9 Other specified hearing loss, unspecified ear: Secondary | ICD-10-CM | POA: Diagnosis not present

## 2015-11-29 DIAGNOSIS — F32A Depression, unspecified: Secondary | ICD-10-CM

## 2015-11-29 DIAGNOSIS — H6123 Impacted cerumen, bilateral: Secondary | ICD-10-CM

## 2015-11-29 DIAGNOSIS — E669 Obesity, unspecified: Secondary | ICD-10-CM

## 2015-11-29 DIAGNOSIS — R7301 Impaired fasting glucose: Secondary | ICD-10-CM | POA: Diagnosis not present

## 2015-11-29 DIAGNOSIS — R0609 Other forms of dyspnea: Secondary | ICD-10-CM | POA: Diagnosis not present

## 2015-11-29 DIAGNOSIS — Z0000 Encounter for general adult medical examination without abnormal findings: Secondary | ICD-10-CM

## 2015-11-29 DIAGNOSIS — F329 Major depressive disorder, single episode, unspecified: Secondary | ICD-10-CM | POA: Diagnosis not present

## 2015-11-29 NOTE — Patient Instructions (Signed)
Results for orders placed or performed in visit on 11/16/15  Lipid panel  Result Value Ref Range   Cholesterol, Total 171 100 - 199 mg/dL   Triglycerides 960129 0 - 149 mg/dL   HDL 53 >45>39 mg/dL   VLDL Cholesterol Cal 26 5 - 40 mg/dL   LDL Calculated 92 0 - 99 mg/dL   Chol/HDL Ratio 3.2 0.0 - 4.4 ratio units  Basic metabolic panel  Result Value Ref Range   Glucose 105 (H) 65 - 99 mg/dL   BUN 9 6 - 24 mg/dL   Creatinine, Ser 4.090.70 0.57 - 1.00 mg/dL   GFR calc non Af Amer 102 >59 mL/min/1.73   GFR calc Af Amer 118 >59 mL/min/1.73   BUN/Creatinine Ratio 13 9 - 23   Sodium 140 134 - 144 mmol/L   Potassium 3.9 3.5 - 5.2 mmol/L   Chloride 99 96 - 106 mmol/L   CO2 26 18 - 29 mmol/L   Calcium 8.6 (L) 8.7 - 10.2 mg/dL  HgB W1XA1c  Result Value Ref Range   Hgb A1c MFr Bld 6.3 (H) 4.8 - 5.6 %   Est. average glucose Bld gHb Est-mCnc 134 mg/dL  Hepatic function panel  Result Value Ref Range   Total Protein 6.7 6.0 - 8.5 g/dL   Albumin 4.2 3.5 - 5.5 g/dL   Bilirubin Total 0.4 0.0 - 1.2 mg/dL   Bilirubin, Direct 9.140.10 0.00 - 0.40 mg/dL   Alkaline Phosphatase 62 39 - 117 IU/L   AST 21 0 - 40 IU/L   ALT 26 0 - 32 IU/L   Prediabetes Eating Plan Prediabetes--also called impaired glucose tolerance or impaired fasting glucose--is a condition that causes blood sugar (blood glucose) levels to be higher than normal. Following a healthy diet can help to keep prediabetes under control. It can also help to lower the risk of type 2 diabetes and heart disease, which are increased in people who have prediabetes. Along with regular exercise, a healthy diet:  Promotes weight loss.  Helps to control blood sugar levels.  Helps to improve the way that the body uses insulin. WHAT DO I NEED TO KNOW ABOUT THIS EATING PLAN?  Use the glycemic index (GI) to plan your meals. The index tells you how quickly a food will raise your blood sugar. Choose low-GI foods. These foods take a longer time to raise blood sugar.  Pay  close attention to the amount of carbohydrates in the food that you eat. Carbohydrates increase blood sugar levels.  Keep track of how many calories you take in. Eating the right amount of calories will help you to achieve a healthy weight. Losing about 7 percent of your starting weight can help to prevent type 2 diabetes.  You may want to follow a Mediterranean diet. This diet includes a lot of vegetables, lean meats or fish, whole grains, fruits, and healthy oils and fats. WHAT FOODS CAN I EAT? Grains Whole grains, such as whole-wheat or whole-grain breads, crackers, cereals, and pasta. Unsweetened oatmeal. Bulgur. Barley. Quinoa. Brown rice. Corn or whole-wheat flour tortillas or taco shells. Vegetables Lettuce. Spinach. Peas. Beets. Cauliflower. Cabbage. Broccoli. Carrots. Tomatoes. Squash. Eggplant. Herbs. Peppers. Onions. Cucumbers. Brussels sprouts. Fruits Berries. Bananas. Apples. Oranges. Grapes. Papaya. Mango. Pomegranate. Kiwi. Grapefruit. Cherries. Meats and Other Protein Sources Seafood. Lean meats, such as chicken and [place] or lean cuts of pork and beef. Tofu. Eggs. Nuts. Beans. Dairy Low-fat or fat-free dairy products, such as yogurt, cottage cheese, and cheese. Beverages Water. Tea.  Coffee. Sugar-free or diet soda. Seltzer water. Milk. Milk alternatives, such as soy or almond milk. Condiments Mustard. Relish. Low-fat, low-sugar ketchup. Low-fat, low-sugar barbecue sauce. Low-fat or fat-free mayonnaise. Sweets and Desserts Sugar-free or low-fat pudding. Sugar-free or low-fat ice cream and other frozen treats. Fats and Oils Avocado. Walnuts. Olive oil. The items listed above may not be a complete list of recommended foods or beverages. Contact your dietitian for more options.  WHAT FOODS ARE NOT RECOMMENDED? Grains Refined white flour and flour products, such as bread, pasta, snack foods, and cereals. Beverages Sweetened drinks, such as sweet iced tea and soda. Sweets and  Desserts Baked goods, such as cake, cupcakes, pastries, cookies, and cheesecake. The items listed above may not be a complete list of foods and beverages to avoid. Contact your dietitian for more information.   This information is not intended to replace advice given to you by your health care provider. Make sure you discuss any questions you have with your health care provider.   Document Released: 01/10/2015 Document Reviewed: 01/10/2015 Elsevier Interactive Patient Education [employer]

## 2015-11-29 NOTE — Progress Notes (Signed)
Subjective:    Patient ID: Deanna Whitaker, female    DOB: 10/22/1965, 50 y.o.   MRN: [id number]  HPI The patient comes in today for a wellness visit.  SOB with any type of exrtion. Please see prior notes. Patient was referred originally to cardiologist. They recommended a stress test. This was set up and the patient had to cancel due to personal circumstances. Never went back. Now notes progressive shortness breath with any type of exertion. Before exercising regularly patient wants to be sure her heart is okay.  Patient notes muffled hearing. Positive history of excess wax  A review of their health history was completed.  A review of medications was also completed.  Any needed refills: N/A  Eating habits: good  Falls/  MVA accidents in past few months: none  Regular exercise: limited due to recovering from broken ankle  Specialist pt sees on regular basis: gynecology, orthopedics  Preventative health issues were discussed.   Additional concerns: please see physical sheet on front of blue folder. Patient has listed several concerns at the bottom.   Patient gets her breast exam and pap smear with Dr. Henderson Cloudomblin.   Pt notes more forgetfulness wi recent   Reg womens prev ck ups with dr Barbette Ortomblim, hx of neg u s and mamm   Last six mo incr forgetfulness, has to be re-told things, aunt has dementia issues  Pt had difficulty with feeling down and depr at times  fam hx mom's sid of the family  m ref for both  [phone] patient's daytime number  Wax  car Review of Systems No headache, no major weight loss or weight gain, no chest pain no back pain abdominal pain no change in bowel habits complete ROS otherwise negative     Objective:   Physical Exam Alert vitals stable blood pressure good on repeat obesity present HEENT ears packed with wax pharynx normal neck supple lungs clear. Heart regular in rhythm. Neuro exam intact ankles trace edema pelvic and breast exam done via  GYN 6  Patient's alert oriented 3 short-term memory appears intact. Very lucid asking appropriate questions  Results for orders placed or performed in visit on 11/16/15  Lipid panel  Result Value Ref Range   Cholesterol, Total 171 100 - 199 mg/dL   Triglycerides 696129 0 - 149 mg/dL   HDL 53 >29>39 mg/dL   VLDL Cholesterol Cal 26 5 - 40 mg/dL   LDL Calculated 92 0 - 99 mg/dL   Chol/HDL Ratio 3.2 0.0 - 4.4 ratio units  Basic metabolic panel  Result Value Ref Range   Glucose 105 (H) 65 - 99 mg/dL   BUN 9 6 - 24 mg/dL   Creatinine, Ser 5.280.70 0.57 - 1.00 mg/dL   GFR calc non Af Amer 102 >59 mL/min/1.73   GFR calc Af Amer 118 >59 mL/min/1.73   BUN/Creatinine Ratio 13 9 - 23   Sodium 140 134 - 144 mmol/L   Potassium 3.9 3.5 - 5.2 mmol/L   Chloride 99 96 - 106 mmol/L   CO2 26 18 - 29 mmol/L   Calcium 8.6 (L) 8.7 - 10.2 mg/dL  HgB U1LA1c  Result Value Ref Range   Hgb A1c MFr Bld 6.3 (H) 4.8 - 5.6 %   Est. average glucose Bld gHb Est-mCnc 134 mg/dL  Hepatic function panel  Result Value Ref Range   Total Protein 6.7 6.0 - 8.5 g/dL   Albumin 4.2 3.5 - 5.5 g/dL   Bilirubin Total 0.4  0.0 - 1.2 mg/dL   Bilirubin, Direct 1.61 0.00 - 0.40 mg/dL   Alkaline Phosphatase 62 39 - 117 IU/L   AST 21 0 - 40 IU/L   ALT 26 0 - 32 IU/L        Assessment & Plan:  Impression 1 worsening short-term memory but with no evidence of impending dementia discussed #2 history right bundle branch block and cardiac workup that was stopped #3 depression anxiety overall stable per patient though at times a challenge #4 obesity discussed #5 bilateral wax substantial over-the-counter interventions have not helped #6 general fatigue discusse #7 impaired fasting glucose prediabetic in nature discussed at length along with ways to avoid d plan [facility] allergy referral. ENT referral. Symptom care discussed. Easily 45 minutes spent most in discussing all these challenges

## 2015-12-11 ENCOUNTER — Ambulatory Visit (INDEPENDENT_AMBULATORY_CARE_PROVIDER_SITE_OTHER): Payer: Managed Care, Other (non HMO) | Admitting: Internal Medicine

## 2015-12-11 ENCOUNTER — Encounter: Payer: Self-pay | Admitting: Internal Medicine

## 2015-12-11 VITALS — BP 142/78 | HR 68 | Ht 63.0 in | Wt 233.0 lb

## 2015-12-11 DIAGNOSIS — I4510 Unspecified right bundle-branch block: Secondary | ICD-10-CM | POA: Diagnosis not present

## 2015-12-11 NOTE — Patient Instructions (Signed)
Your physician recommends that you continue on your current medications as directed. Please refer to the Current Medication list given to you today.  Your physician has recommended that you wear a holter monitor. Holter monitors are medical devices that record the heart's electrical activity. Doctors most often use these monitors to diagnose arrhythmias. Arrhythmias are problems with the speed or rhythm of the heartbeat. The monitor is a small, portable device. You can wear one while you do your normal daily activities. This is usually used to diagnose what is causing palpitations/syncope (passing out).  Follow up with your physician will depend on test results.  

## 2015-12-11 NOTE — Progress Notes (Signed)
Cardiology Office Note   Date:  12/11/2015   ID:  DANIKA KLUENDER, DOB 1966/06/24, MRN [id number]  PCP:  Lubertha South, MD  Cardiologist:   Dietrich Pates, MD   F/U of Chest Pain    History of Present Illness: Deanna Whitaker is a 50 y.o. female with a history of CP  I sdaw hier in November 2015  Followed by Dr Gerda Diss  Echo witn nromal pumping function   Seen recently by NP in office  Complained of SOB    R ankle surgery   No CP now  Has to take time doing thing due to SOB  Wt is up  Thinks that may be hurting   Outpatient Prescriptions Prior to Visit  Medication Sig Dispense Refill  . NAPROXEN PO Take 2 tablets by mouth as directed. Patient unsure of MG and said she just uses this PRN for mild pain     No facility-administered medications prior to visit.     Allergies:   Review of patient's allergies indicates no known allergies.   Past Medical History  Diagnosis Date  . Anxiety   . Depression     Past Surgical History  Procedure Laterality Date  . Abdominal hysterectomy    . Orif ankle fracture Right 02/17/2015    Procedure: OPEN REDUCTION INTERNAL FIXATION (ORIF) RIGHT ANKLE FRACTURE;  Surgeon: Eldred Manges, MD;  Location: [facility] OR;  Service: Orthopedics;  Laterality: Right;     Social History:  The patient  reports that she has never smoked. She has never used smokeless tobacco. She reports that she drinks alcohol. She reports that she does not use illicit drugs.   Family History:  The patient's family history includes Heart disease in her mother. Mom had angioplasty in her 51s  Used to be a smoker     ROS:  Please see the history of present illness. All other systems are reviewed and  Negative to the above problem except as noted.    PHYSICAL EXAM: VS:  BP 142/78 mmHg  Pulse 68  Ht  (1.6 m)  Wt 233 lb (105.688 kg)  BMI 41.28 kg/m2  GEN: Morbidly obese 50 yo in no acute distress HEENT: normal Neck: no JVD, carotid bruits, or masses Cardiac: RRR; no  murmurs, rubs, or gallops,no edema  Respiratory:  clear to auscultation bilaterally, normal work of breathing GI: soft, nontender, nondistended, + BS  No hepatomegaly  MS: no deformity Moving all extremities   Skin: warm and dry, no rash Neuro:  Strength and sensation are intact Psych: euthymic mood, full affect   EKG:  EKG is ordered today.  SR 68  RBBB.  Occasional PVC    Lipid Panel    Component Value Date/Time   CHOL 171 11/27/2015 1441   TRIG 129 11/27/2015 1441   HDL 53 11/27/2015 1441   CHOLHDL 3.2 11/27/2015 1441   LDLCALC 92 11/27/2015 1441      Wt Readings from Last 3 Encounters:  12/11/15 233 lb (105.688 kg)  11/29/15 233 lb (105.688 kg)  06/26/15 218 lb (98.884 kg)      ASSESSMENT AND PLAN:  1  Dypsnea  I am not convinced this represents a cardiac problem  Echo in the past OK  No CP  No real progression since I saw her  Her ekg today has 3 PVCs  I would recomm a 24 hour holter monitor to eval PVC burden  If occasional I would recomm enlisting in an exercise  regimen  There are gyms with low payments (10 dollars per month)    I would not plan on stress testing   Lipids look good from March LDL was 92  HDL 53   She needs to lose wt.     Disposition:   FU with me depends on test results    Signed, Dietrich PatesPaula Layce Sprung, MD  12/11/2015 9:18 AM    [facility] [address], [place], [place]  [address] Phone: [phone]; Fax: [phone]

## 2015-12-14 ENCOUNTER — Other Ambulatory Visit: Payer: Self-pay | Admitting: Internal Medicine

## 2015-12-14 DIAGNOSIS — I493 Ventricular premature depolarization: Secondary | ICD-10-CM

## 2015-12-18 ENCOUNTER — Ambulatory Visit (INDEPENDENT_AMBULATORY_CARE_PROVIDER_SITE_OTHER): Payer: Managed Care, Other (non HMO)

## 2015-12-18 DIAGNOSIS — I493 Ventricular premature depolarization: Secondary | ICD-10-CM | POA: Diagnosis not present

## 2015-12-27 ENCOUNTER — Telehealth: Payer: Self-pay | Admitting: Internal Medicine

## 2015-12-27 DIAGNOSIS — R002 Palpitations: Secondary | ICD-10-CM

## 2015-12-27 NOTE — Telephone Encounter (Signed)
Monitor was done on 12/18/15.  Will forward to Dr. Tenny Crawoss for review so that patient can be informed of results.

## 2015-12-27 NOTE — Telephone Encounter (Signed)
New message      Calling to get holter monitor results

## 2015-12-28 NOTE — Telephone Encounter (Signed)
I have reviewed holter  Sinus rhythm with frequent PVCs I would recomm 1  Echo  Palpiatisons 2.  Low dose Toprol XL 25 mg per day

## 2015-12-29 MED ORDER — METOPROLOL SUCCINATE ER 25 MG PO TB24: 25.0000 mg | ORAL_TABLET | Freq: Every day | ORAL | Status: DC

## 2015-12-29 NOTE — Telephone Encounter (Signed)
Spoke with patient and informed. She is aware scheduler will be calling to set up echo.

## 2016-01-04 ENCOUNTER — Telehealth: Payer: Self-pay | Admitting: Internal Medicine

## 2016-01-04 NOTE — Telephone Encounter (Signed)
01-04-16 Lvm to call to schedule echo/saf

## 2016-01-05 ENCOUNTER — Ambulatory Visit: Payer: Medicaid Other | Admitting: Cardiology

## 2016-01-25 ENCOUNTER — Encounter: Payer: Self-pay | Admitting: Family Medicine

## 2016-01-26 ENCOUNTER — Ambulatory Visit (HOSPITAL_COMMUNITY): Payer: Managed Care, Other (non HMO) | Attending: Cardiology

## 2016-01-26 ENCOUNTER — Other Ambulatory Visit: Payer: Self-pay

## 2016-01-26 DIAGNOSIS — Z6841 Body Mass Index (BMI) 40.0 and over, adult: Secondary | ICD-10-CM | POA: Diagnosis not present

## 2016-01-26 DIAGNOSIS — R002 Palpitations: Secondary | ICD-10-CM

## 2016-01-26 DIAGNOSIS — R0600 Dyspnea, unspecified: Secondary | ICD-10-CM | POA: Insufficient documentation

## 2016-01-26 DIAGNOSIS — E669 Obesity, unspecified: Secondary | ICD-10-CM | POA: Insufficient documentation

## 2016-01-31 ENCOUNTER — Telehealth: Payer: Self-pay

## 2016-01-31 ENCOUNTER — Telehealth: Payer: Self-pay | Admitting: *Deleted

## 2016-01-31 NOTE — Telephone Encounter (Signed)
Called pt to give her results to the holter monitor and she let me know that she had already been called two different times pertaining to the same test. She is waiting for the results to her echo. She also said that she did not want to take any medication for her PVSs as they do not bother her.

## 2016-01-31 NOTE — Telephone Encounter (Signed)
-----   Message from Dietrich PatesPaula Ross V, MD sent at 01/26/2016 10:02 PM EDT ----- Echo shows normal pumping function of heart  Would recomm repeat holter monitor in 6 months  Pt with frequent PVCs on recent holter

## 2016-01-31 NOTE — Telephone Encounter (Signed)
Patient informed of echo results and recommendation to repeat holter in 6 months. She has chosen not to start Toprol XL at this time for her PVCs.  Also, she states she will call back to schedule an appointment as needed with Dr. Tenny Crawoss, since her PCP, Dr. Gerda DissLuking referred her for the palpitations, vs following up with Dr. Tenny Crawoss in [place].  Pt stated someone called her this am to schedule that appointment.

## 2016-03-06 ENCOUNTER — Telehealth: Payer: Self-pay | Admitting: Internal Medicine

## 2016-03-06 NOTE — Telephone Encounter (Signed)
-----   Message from Fonnie BirkenheadLisa R McGhee, [place] sent at 01/31/2016  8:10 AM EDT ----- Could you please schedule a fu for pt when Dr. Tenny Crawoss [place] per Dr. Tenny Crawoss.

## 2016-03-06 NOTE — Telephone Encounter (Signed)
lvm to call office to schedule appointment/tg

## 2016-05-02 ENCOUNTER — Telehealth: Payer: Self-pay

## 2016-05-02 NOTE — Telephone Encounter (Signed)
Left message for pt. To return call to set up an appointment to see Dr. Tenny Crawoss here at the [place]. Aurther Lofterry has has also made several attempts to reach pt. To schedule an appointment with no Dr. Tenny Crawoss with no luck.

## 2017-10-15 ENCOUNTER — Other Ambulatory Visit: Payer: Self-pay | Admitting: Obstetrics and Gynecology

## 2017-10-15 DIAGNOSIS — N6489 Other specified disorders of breast: Secondary | ICD-10-CM

## 2017-10-21 ENCOUNTER — Ambulatory Visit
Admission: RE | Admit: 2017-10-21 | Discharge: 2017-10-21 | Disposition: A | Discharge status: Home / Self Care | Payer: Managed Care, Other (non HMO) | Source: Ambulatory Visit | Attending: Obstetrics and Gynecology | Admitting: Obstetrics and Gynecology

## 2017-10-21 ENCOUNTER — Ambulatory Visit: Payer: Managed Care, Other (non HMO)

## 2017-10-21 DIAGNOSIS — N6489 Other specified disorders of breast: Secondary | ICD-10-CM

## 2017-12-15 ENCOUNTER — Ambulatory Visit: Payer: Managed Care, Other (non HMO) | Admitting: Family Medicine

## 2017-12-15 ENCOUNTER — Encounter: Payer: Self-pay | Admitting: Family Medicine

## 2017-12-15 VITALS — BP 138/84 | Ht 63.0 in | Wt 238.0 lb

## 2017-12-15 DIAGNOSIS — R7303 Prediabetes: Secondary | ICD-10-CM

## 2017-12-15 DIAGNOSIS — I10 Essential (primary) hypertension: Secondary | ICD-10-CM

## 2017-12-15 DIAGNOSIS — M79645 Pain in left finger(s): Secondary | ICD-10-CM

## 2017-12-15 DIAGNOSIS — R739 Hyperglycemia, unspecified: Secondary | ICD-10-CM | POA: Diagnosis not present

## 2017-12-15 DIAGNOSIS — G8929 Other chronic pain: Secondary | ICD-10-CM

## 2017-12-15 LAB — POCT GLUCOSE (DEVICE FOR HOME USE): Glucose Fasting, POC: 122 mg/dL — AB (ref 70–99)

## 2017-12-15 LAB — POCT GLYCOSYLATED HEMOGLOBIN (HGB A1C): Hemoglobin A1C: 6

## 2017-12-15 NOTE — Progress Notes (Signed)
   Subjective:    Patient ID: Deanna McgillWanda E Winch, female    DOB: 10/11/1965, 52 y.o.   MRN: [id number] Patient arrives office with numerous concerns HPI Impaired fasting glucose. Pt wanted blood sugar tested today.  Has started working hard on sugaar intake  Has cut down sugars and working on diet more and more   Pt states "I was never presribed med" for blood pressure  Pt checking bp s on her own, top numb low 130s to mid 150s  Bottom number 82 to 89  Has tried to cut salt down  Has chronic swelling in the right ankle, the ortho doc rec keeping the hardware in, pt notes incr challenges with the ankle   Pt notes knot on left thumb, has it ongoing , hurts some, types all day       Right ankle swelling since June 2016.   bp has been running high.   Knot on left thump. Sometimes painful. Came up about 3 months ago.   Results for orders placed or performed in visit on 12/15/17  POCT glycosylated hemoglobin (Hb A1C)  Result Value Ref Range   Hemoglobin A1C 6.0   POCT Glucose (Device for Home Use)  Result Value Ref Range   Glucose Fasting, POC 122 (A) 70 - 99 mg/dL   POC Glucose  70 - 99 mg/dl     Review of Systems No headache, no major weight loss or weight gain, no chest pain no back pain abdominal pain no change in bowel habits complete ROS otherwise negative     Objective:   Physical Exam  Alert and oriented, vitals reviewed and stable, NAD ENT-TM's and ext canals WNL bilat via otoscopic exam Soft palate, tonsils and post pharynx WNL via oropharyngeal exam Neck-symmetric, no masses; thyroid nonpalpable and nontender Pulmonary-no tachypnea or accessory muscle use; Clear without wheezes via auscultation Card--no abnrml murmurs, rhythm reg and rate WNL Carotid pulses symmetric, without bruits Right dorsal thumb mild swelling ganglion on cystlike prominence  Right ankle surgical scar noted mild increase edema versus left ankle.  Trace venous stasis edema both  legs      Assessment & Plan:  1 impression hypertension new diagnosis nonpharmacological interventions discussed educational information given.  Recheck in 6 months of blood pressure still elevated will consider medicine  2.  Left thumb tendinitis versus early ganglion cyst discussed symptom care only  3.  Right ankle pain and swelling post surgical intervention relationship discussed with patient  4.  Prediabetes discussed diet exercise weight loss discussed and encouraged  5.  Palpitations.  Patient still gets them.  Was a bit confused regarding advice from cardiologist recommended that they actually wanted to follow-up with her patient will schedule this on her own

## 2017-12-15 NOTE — Patient Instructions (Signed)

## 2017-12-22 ENCOUNTER — Telehealth: Payer: Self-pay | Admitting: Family Medicine

## 2017-12-22 NOTE — Telephone Encounter (Signed)
Review blood work results in [facility]

## 2017-12-25 ENCOUNTER — Telehealth: Payer: Self-pay | Admitting: Family Medicine

## 2017-12-25 DIAGNOSIS — I493 Ventricular premature depolarization: Secondary | ICD-10-CM

## 2017-12-25 NOTE — Telephone Encounter (Signed)
Referral put in. Left message to return call to notify pt.

## 2017-12-25 NOTE — Telephone Encounter (Signed)
Patient said that she saw Dr. Brett CanalesSteve a couple of weeks ago and he told her to follow back up with her Cardiologist.  She said she doesn't want to see Dr. Tenny Crawoss anymore, she would like to be referred somewhere else.  Please advise.

## 2017-12-25 NOTE — Telephone Encounter (Signed)
od different card group re for referral: hx of freq pvc's, had echo and holter via card, they rec a repeat o v and repeat holter in six months after she declined toprol, pt never did schedule that, wants to see different card group

## 2017-12-30 ENCOUNTER — Telehealth: Payer: Self-pay | Admitting: Internal Medicine

## 2017-12-30 NOTE — Telephone Encounter (Signed)
New message  Deanna Whitaker verbalzied that she is calling on the behalf of the patient she wants  To change care from Dr.Ross to Dr.Smith  Do both providers agree to the change of care?

## 2017-12-31 NOTE — Telephone Encounter (Signed)
OK to switch 

## 2018-01-01 NOTE — Telephone Encounter (Signed)
Patient wants to see Dr.Smith with heart and vasular.Per pt he is taking new pt's.

## 2018-01-01 NOTE — Telephone Encounter (Signed)
I have reviewed the records.  It would be prudent to stay with Dr. Tenny Crawoss as significant work-up is already been done.  I do not believe I would have anything further to add.

## 2018-01-05 ENCOUNTER — Encounter: Payer: Self-pay | Admitting: Family Medicine

## 2018-01-05 NOTE — Telephone Encounter (Signed)
See message in chart - Dr. Katrinka Blazing declined to take pt due to her being a pt of Dr. Tenny Craw & already having an extensive workup  Will contact pt to discuss options

## 2018-01-08 NOTE — Telephone Encounter (Signed)
Discussed with pt a few days ago, pt will discuss with her son & let me know

## 2018-06-16 ENCOUNTER — Encounter: Payer: Self-pay | Admitting: Family Medicine

## 2018-06-16 ENCOUNTER — Ambulatory Visit: Payer: Managed Care, Other (non HMO) | Admitting: Family Medicine

## 2018-06-16 VITALS — BP 130/82 | Ht 63.0 in | Wt 237.4 lb

## 2018-06-16 DIAGNOSIS — I10 Essential (primary) hypertension: Secondary | ICD-10-CM | POA: Diagnosis not present

## 2018-06-16 DIAGNOSIS — R5383 Other fatigue: Secondary | ICD-10-CM | POA: Diagnosis not present

## 2018-06-16 DIAGNOSIS — R7303 Prediabetes: Secondary | ICD-10-CM

## 2018-06-16 DIAGNOSIS — I493 Ventricular premature depolarization: Secondary | ICD-10-CM | POA: Diagnosis not present

## 2018-06-16 NOTE — Progress Notes (Signed)
   Subjective:    Patient ID: Deanna Whitaker, female    DOB: [birth year], 52 y.o.   MRN: [id number]  Hypertension  This is a chronic problem. The current episode started more than 1 year ago. Risk factors for coronary artery disease include post-menopausal state. Treatments tried: diet. There are no compliance problems.      whe pt saw card numers were good    Still experiencing the paplpitations, getting exercise in, push mowing the yard   Not on any meds for b p or palpitations at this time    Review of Systems No headache, no major weight loss or weight gain, no chest pain no back pain abdominal pain no change in bowel habits complete ROS otherwise negative     Objective:   Physical Exam  Alert vitals stable, NAD. Blood pressure good on repeat. HEENT normal. Lungs clear. Heart regular rate and rhythm.       Assessment & Plan:  Hypertension, off meds, bp her good, self monitors disc, diet exercise disc  Palpitations clinically stable  Prediabetes status uncertain time to ck b w no screening b w recently  Greater than 50% of this 25 minute face to face visit was spent in counseling and discussion and coordination of care regarding the above diagnosis/diagnosies  No need for six mo f u now off all meds  Await b w for further disc

## 2018-06-16 NOTE — Patient Instructions (Signed)
[facility]   lifesource bp cuffs  [place]

## 2018-06-17 LAB — BASIC METABOLIC PANEL
BUN/Creatinine Ratio: 9 (ref 9–23)
BUN: 7 mg/dL (ref 6–24)
CALCIUM: 9.1 mg/dL (ref 8.7–10.2)
CHLORIDE: 100 mmol/L (ref 96–106)
CO2: 28 mmol/L (ref 20–29)
Creatinine, Ser: 0.77 mg/dL (ref 0.57–1.00)
GFR calc Af Amer: 103 mL/min/{1.73_m2} (ref >59)
GFR calc non Af Amer: 89 mL/min/{1.73_m2} (ref >59)
GLUCOSE: 113 mg/dL — AB (ref 65–99)
Potassium: 3.9 mmol/L (ref 3.5–5.2)
Sodium: 141 mmol/L (ref 134–144)

## 2018-06-17 LAB — HEPATIC FUNCTION PANEL
ALT: 25 IU/L (ref 0–32)
AST: 19 IU/L (ref 0–40)
Albumin: 4.4 g/dL (ref 3.5–5.5)
Alkaline Phosphatase: 65 IU/L (ref 39–117)
Bilirubin Total: 0.5 mg/dL (ref 0.0–1.2)
Bilirubin, Direct: 0.13 mg/dL (ref 0.00–0.40)
Total Protein: 6.9 g/dL (ref 6.0–8.5)

## 2018-06-17 LAB — LIPID PANEL
Chol/HDL Ratio: 3.2 ratio (ref 0.0–4.4)
Cholesterol, Total: 155 mg/dL (ref 100–199)
HDL: 49 mg/dL (ref >39)
LDL Calculated: 88 mg/dL (ref 0–99)
Triglycerides: 90 mg/dL (ref 0–149)
VLDL Cholesterol Cal: 18 mg/dL (ref 5–40)

## 2018-06-17 LAB — TSH: TSH: 1.01 u[IU]/mL (ref 0.450–4.500)

## 2018-06-17 LAB — HEMOGLOBIN A1C
ESTIMATED AVERAGE GLUCOSE: 131 mg/dL
HEMOGLOBIN A1C: 6.2 % — AB (ref 4.8–5.6)

## 2018-06-22 ENCOUNTER — Encounter: Payer: Self-pay | Admitting: Family Medicine

## 2018-10-29 ENCOUNTER — Ambulatory Visit (HOSPITAL_COMMUNITY)
Admission: EM | Admit: 2018-10-29 | Discharge: 2018-10-29 | Disposition: A | Discharge status: Home / Self Care | Payer: Managed Care, Other (non HMO) | Attending: Internal Medicine | Admitting: Internal Medicine

## 2018-10-29 ENCOUNTER — Encounter (HOSPITAL_COMMUNITY): Payer: Self-pay

## 2018-10-29 DIAGNOSIS — M5010 Cervical disc disorder with radiculopathy, unspecified cervical region: Secondary | ICD-10-CM

## 2018-10-29 MED ORDER — CYCLOBENZAPRINE HCL 10 MG PO TABS: 10.0000 mg | ORAL_TABLET | Freq: Two times a day (BID) | ORAL | 0 refills | Status: DC | PRN

## 2018-10-29 NOTE — Discharge Instructions (Signed)
Please call Dr.S. Luking's office in the morning and  ask about how you can be schedule for MRI of cervical spine.

## 2018-10-29 NOTE — ED Triage Notes (Signed)
Pt presents with left neck and shoulder pain that she believes is related to moving something.

## 2018-10-29 NOTE — ED Provider Notes (Addendum)
[facility]    CSN: [id number] Arrival date & time: 10/29/18  1629     History   Chief Complaint Chief Complaint  Patient presents with  . Neck Pain    Left  . Shoulder Pain    Left    HPI Deanna Whitaker is a 53 y.o. female with a history of depression comes to the urgent care with complaints of neck pain of several days duration.  Patient was involved in removing her door in her house.  On the following day she started having neck pain which was of moderate severity but getting progressively worse.  Pain is sharp in nature and radiates to the left arm.  Movement of the neck makes the pain worse.  No relieving factors known.  Neck pain is associated with numbness in the left upper extremity.  No dizziness, near syncope or syncopal episode.  HPI  Past Medical History:  Diagnosis Date  . Anxiety   . Depression     Patient Active Problem List   Diagnosis Date Noted  . Prediabetes 12/15/2017  . Essential hypertension 12/15/2017  . Ankle fracture 02/18/2015  . Bimalleolar fracture of right ankle 02/17/2015  . Ankle fracture, bimalleolar, closed 02/17/2015  . Right bundle branch block 07/26/2014  . Depression 07/17/2014  . Insomnia 07/17/2014  . Impaired fasting glucose 02/07/2014  . Arthritis of left knee 02/07/2014  . Obesity, unspecified 02/07/2014  . Family history of breast cancer 02/07/2014    Past Surgical History:  Procedure Laterality Date  . ABDOMINAL HYSTERECTOMY    . BREAST BIOPSY Right   . ORIF ANKLE FRACTURE Right 02/17/2015   Procedure: OPEN REDUCTION INTERNAL FIXATION (ORIF) RIGHT ANKLE FRACTURE;  Surgeon: Eldred Manges, MD;  Location: [facility];  Service: Orthopedics;  Laterality: Right;    OB History   No obstetric history on file.      Home Medications    Prior to Admission medications   Medication Sig Start Date End Date Taking? Authorizing Provider  metoprolol succinate (TOPROL XL) 25 MG 24 hr tablet Take 1 tablet (25 mg total)  by mouth daily. Patient not taking: Reported on 12/15/2017 12/29/15   Pricilla Riffle, MD    Family History Family History  Problem Relation Age of Onset  . Heart disease Mother     Social History Social History   Tobacco Use  . Smoking status: Never Smoker  . Smokeless tobacco: Never Used  Substance Use Topics  . Alcohol use: Yes    Alcohol/week: 0.0 standard drinks    Comment: monthly  . Drug use: No     Allergies   Patient has no known allergies.   Review of Systems Review of Systems  Cardiovascular: Negative for chest pain and leg swelling.  Genitourinary: Negative for dysuria, enuresis and urgency.  Musculoskeletal: Positive for arthralgias, back pain, neck pain and neck stiffness. Negative for gait problem.  Skin: Negative for rash and wound.  Neurological: Positive for numbness. Negative for dizziness, weakness and light-headedness.  Hematological: Negative for adenopathy.  Psychiatric/Behavioral: Positive for self-injury. Negative for confusion.     Physical Exam Triage Vital Signs ED Triage Vitals  Enc Vitals Group     BP 10/29/18 1708 (!) 151/84     Pulse Rate 10/29/18 1708 60     Resp 10/29/18 1708 18     Temp 10/29/18 1708 98.3 F (36.8 C)     Temp Source 10/29/18 1708 Oral     SpO2 10/29/18 1708 100 %  Weight --      Height --      Head Circumference --      Peak Flow --      Pain Score 10/29/18 1711 8     Pain Loc --      Pain Edu? --      Excl. in GC? --    No data found.  Updated Vital Signs BP (!) 151/84 (BP Location: Right Arm)   Pulse 60   Temp 98.3 F (36.8 C) (Oral)   Resp 18   SpO2 100%   Visual Acuity Right Eye Distance:   Left Eye Distance:   Bilateral Distance:    Right Eye Near:   Left Eye Near:    Bilateral Near:     Physical Exam Vitals signs and nursing note reviewed.  Constitutional:      Appearance: Normal appearance.  Neck:     Musculoskeletal: Neck rigidity and muscular tenderness present.      Vascular: No carotid bruit.     Comments: Painful limited range of motion of the neck Cardiovascular:     Pulses: Normal pulses.     Heart sounds: Normal heart sounds.  Pulmonary:     Effort: Pulmonary effort is normal.     Breath sounds: Normal breath sounds.  Musculoskeletal: Normal range of motion.     Comments: Full range of motion with tenderness of the left shoulder.  Lymphadenopathy:     Cervical: No cervical adenopathy.  Skin:    Capillary Refill: Capillary refill takes less than 2 seconds.     Findings: No erythema or rash.  Neurological:     Mental Status: She is alert.     Cranial Nerves: No cranial nerve deficit.     Sensory: No sensory deficit.     Motor: No weakness.     Gait: Gait normal.     Deep Tendon Reflexes: Reflexes normal.      UC Treatments / Results  Labs (all labs ordered are listed, but only abnormal results are displayed) Labs Reviewed - No data to display  EKG None  Radiology No results found.  Procedures Procedures (including critical care time)  Medications Ordered in UC Medications - No data to display  Initial Impression / Assessment and Plan / UC Course  I have reviewed the triage vital signs and the nursing notes.  Pertinent labs & imaging results that were available during my care of the patient were reviewed by me and considered in my medical decision making (see chart for details).     1.  Cervical radiculopathy: Patient will need MRI evaluation of the neck giving the nature of the injury and symptoms at presentation. Patient was directed to call the primary care physician to have MRI of the neck done as outpatient Patient will likely benefit from neurosurgery referral if MRI is significant for nerve  Compression.  Flexeril was added to the discharge medications. This was omitted on the initial discharge summary  Final Clinical Impressions(s) / UC Diagnoses   Final diagnoses:  Cervical disc disorder with radiculopathy  of cervical region   Discharge Instructions   None    ED Prescriptions    None     Controlled Substance Prescriptions [place] Controlled Substance Registry consulted? No   Merrilee Jansky, MD 10/29/18 Bennie Hind    Merrilee Jansky, MD 10/29/18 Barry Brunner

## 2018-10-30 ENCOUNTER — Encounter (HOSPITAL_COMMUNITY): Payer: Self-pay | Admitting: Emergency Medicine

## 2018-10-30 ENCOUNTER — Other Ambulatory Visit: Payer: Self-pay

## 2018-10-30 ENCOUNTER — Emergency Department (HOSPITAL_COMMUNITY)
Admission: EM | Admit: 2018-10-30 | Discharge: 2018-10-30 | Disposition: A | Discharge status: Home / Self Care | Payer: Managed Care, Other (non HMO) | Attending: Emergency Medicine | Admitting: Emergency Medicine

## 2018-10-30 DIAGNOSIS — I10 Essential (primary) hypertension: Secondary | ICD-10-CM | POA: Diagnosis not present

## 2018-10-30 DIAGNOSIS — M62838 Other muscle spasm: Secondary | ICD-10-CM | POA: Diagnosis not present

## 2018-10-30 DIAGNOSIS — M542 Cervicalgia: Secondary | ICD-10-CM | POA: Diagnosis present

## 2018-10-30 DIAGNOSIS — Z79899 Other long term (current) drug therapy: Secondary | ICD-10-CM | POA: Insufficient documentation

## 2018-10-30 MED ORDER — KETOROLAC TROMETHAMINE 60 MG/2ML IM SOLN
60.0000 mg | Freq: Once | INTRAMUSCULAR | Status: AC
  Administered 2018-10-30: 60 mg via INTRAMUSCULAR
  Filled 2018-10-30: qty 2

## 2018-10-30 MED ORDER — NAPROXEN 500 MG PO TABS: 500.0000 mg | ORAL_TABLET | Freq: Two times a day (BID) | ORAL | 0 refills | Status: DC

## 2018-10-30 NOTE — ED Provider Notes (Signed)
[facility] Provider Note   CSN: [id number] Arrival date & time: 10/30/18  1002    History   Chief Complaint Chief Complaint  Patient presents with  . Neck Pain    HPI Deanna Whitaker is a 53 y.o. female.     Patient is a 53 year old female with a history of anxiety and depression who is presenting today with ongoing pain in the left side of her neck radiating into her shoulder and arm.  Patient was seen in urgent care yesterday for same symptoms.  At that time she was prescribed Flexeril.  She states she took that and some of the numbness in her arm has actually improved but she still having ongoing pain in the left side of her neck and shoulder blade area.  She states she just wants to get some relief and she has not been able to sleep well because of the discomfort.  She has no weakness of the arm, central neck pain or prior symptoms of similar issues.  She denies any infectious symptoms or cardiac symptoms such as chest pain, shortness of breath or fever.  The history is provided by the patient.  Neck Pain  Pain location:  L side Quality:  Aching, cramping and shooting Pain radiates to:  L shoulder and L scapula Pain severity:  Severe Pain is:  Same all the time Onset quality:  Gradual Duration:  1 week Timing:  Constant Progression:  Unchanged Chronicity:  New Context comment:  Pain started today after she was pulling on a door to get it off the frame Relieved by:  Nothing Worsened by:  Position and twisting Ineffective treatments:  Muscle relaxants and heat Associated symptoms: numbness   Associated symptoms: no chest pain, no fever, no headaches, no leg pain, no syncope, no visual change and no weakness   Risk factors: no hx of head and neck radiation, no recent head injury and no recurrent falls     Past Medical History:  Diagnosis Date  . Anxiety   . Depression     Patient Active Problem List   Diagnosis Date Noted    . Prediabetes 12/15/2017  . Essential hypertension 12/15/2017  . Ankle fracture 02/18/2015  . Bimalleolar fracture of right ankle 02/17/2015  . Ankle fracture, bimalleolar, closed 02/17/2015  . Right bundle branch block 07/26/2014  . Depression 07/17/2014  . Insomnia 07/17/2014  . Impaired fasting glucose 02/07/2014  . Arthritis of left knee 02/07/2014  . Obesity, unspecified 02/07/2014  . Family history of breast cancer 02/07/2014    Past Surgical History:  Procedure Laterality Date  . ABDOMINAL HYSTERECTOMY    . BREAST BIOPSY Right   . ORIF ANKLE FRACTURE Right 02/17/2015   Procedure: OPEN REDUCTION INTERNAL FIXATION (ORIF) RIGHT ANKLE FRACTURE;  Surgeon: Eldred MangesMark C Yates, MD;  Location: [facility];  Service: Orthopedics;  Laterality: Right;     OB History   No obstetric history on file.      Home Medications    Prior to Admission medications   Medication Sig Start Date End Date Taking? Authorizing Provider  cyclobenzaprine (FLEXERIL) 10 MG tablet Take 1 tablet (10 mg total) by mouth 2 (two) times daily as needed for muscle spasms. 10/29/18   LampteyBritta Mccreedy, Philip O, MD  metoprolol succinate (TOPROL XL) 25 MG 24 hr tablet Take 1 tablet (25 mg total) by mouth daily. Patient not taking: Reported on 12/15/2017 12/29/15   Pricilla Riffleoss, Paula V, MD    Family History Family  History  Problem Relation Age of Onset  . Heart disease Mother     Social History Social History   Tobacco Use  . Smoking status: Never Smoker  . Smokeless tobacco: Never Used  Substance Use Topics  . Alcohol use: Yes    Alcohol/week: 0.0 standard drinks    Comment: monthly  . Drug use: No     Allergies   Patient has no known allergies.   Review of Systems Review of Systems  Constitutional: Negative for fever.  Cardiovascular: Negative for chest pain and syncope.  Musculoskeletal: Positive for neck pain.  Neurological: Positive for numbness. Negative for weakness and headaches.  All other systems reviewed and  are negative.    Physical Exam Updated Vital Signs BP (!) 146/88 (BP Location: Right Arm)   Pulse 70   Temp 98.6 F (37 C) (Oral)   Resp 18   Ht 5\' 3"  (1.6 m)   Wt 108 kg   SpO2 98%   BMI 42.18 kg/m   Physical Exam Vitals signs and nursing note reviewed.  Constitutional:      General: She is not in acute distress.    Appearance: She is well-developed.  HENT:     Head: Normocephalic and atraumatic.  Eyes:     Pupils: Pupils are equal, round, and reactive to light.  Neck:     Musculoskeletal: Muscular tenderness present. No neck rigidity or spinous process tenderness.      Comments: Spasm and muscle tenderness present in the left paracervical, trapezius and subscapular muscles Cardiovascular:     Rate and Rhythm: Normal rate.  Pulmonary:     Effort: Pulmonary effort is normal. No respiratory distress.  Musculoskeletal: Normal range of motion.     Left shoulder: She exhibits tenderness and spasm. She exhibits normal range of motion, no bony tenderness, normal pulse and normal strength.       Arms:     Comments: No edema  Skin:    General: Skin is warm and dry.     Findings: No rash.  Neurological:     General: No focal deficit present.     Mental Status: She is alert and oriented to person, place, and time. Mental status is at baseline.     Cranial Nerves: No cranial nerve deficit.     Comments: 5 out of 5 strength in left bicep, tricep, forearm and hand grip.  2+ radial pulse on the left.  Sensation is intact  Psychiatric:        Behavior: Behavior normal.      ED Treatments / Results  Labs (all labs ordered are listed, but only abnormal results are displayed) Labs Reviewed - No data to display  EKG None  Radiology No results found.  Procedures Procedures (including critical care time)  Medications Ordered in ED Medications  ketorolac (TORADOL) injection 60 mg (has no administration in time range)     Initial Impression / Assessment and Plan / ED  Course  I have reviewed the triage vital signs and the nursing notes.  Pertinent labs & imaging results that were available during my care of the patient were reviewed by me and considered in my medical decision making (see chart for details).    Patient presenting with left-sided muscular neck and shoulder pain who did have numbness shooting down the arm that is now improved after taking Flexeril yesterday.  Possibility that patient has cervical radiculopathy versus all muscular spasm and resulting issues.  Patient has no known medical  problems.  Low suspicion for pathologic fracture, epidural abscess or discitis.  Will treat with Toradol here and patient had not been taking any NSAIDs.  We will have her start naproxen for the next week in addition to the Flexeril and use lidocaine patches.   Final Clinical Impressions(s) / ED Diagnoses   Final diagnoses:  Muscle spasms of neck    ED Discharge Orders         Ordered    naproxen (NAPROSYN) 500 MG tablet  2 times daily     10/30/18 1127           Gwyneth Sprout, MD 10/30/18 1127

## 2018-10-30 NOTE — ED Triage Notes (Signed)
Pt reports neck on the left side that radiates to her shoulder when she moves. Pt reports going to UC yesterday, being given medication, and referred back to her PCP for an outpt MRI. Pt reports her PCP is closed today and she wants to get relief.

## 2018-10-30 NOTE — Discharge Instructions (Signed)
Use the naproxen 2 times a day for a week.  Use the muscle relaxer for spasm and you can try the lidocaine patches and heat.  Therapeutic massage may be beneficial if the pain continues.  You should follow-up with your doctor if the pain does not go away or starts getting worse.

## 2018-11-05 ENCOUNTER — Ambulatory Visit: Payer: Managed Care, Other (non HMO) | Admitting: Family Medicine

## 2018-11-05 ENCOUNTER — Encounter: Payer: Self-pay | Admitting: Family Medicine

## 2018-11-05 VITALS — BP 130/90 | Ht 63.0 in | Wt 240.6 lb

## 2018-11-05 DIAGNOSIS — M542 Cervicalgia: Secondary | ICD-10-CM | POA: Diagnosis not present

## 2018-11-05 DIAGNOSIS — R29898 Other symptoms and signs involving the musculoskeletal system: Secondary | ICD-10-CM | POA: Diagnosis not present

## 2018-11-05 NOTE — Progress Notes (Signed)
   Subjective:    Patient ID: Deanna Whitaker, female    DOB: 12/16/1965, 53 y.o.   MRN: [id number]  Back Pain  This is a new problem. The current episode started 1 to 4 weeks ago. Pain location: upper back pain. Radiates to: radiates to left arm. The pain is the same all the time.   Went to Urgent Care last Thursday, pinched nerve follow up with PCP to have MRI done. Flexeril given and pt went to ER next day due to being in severe pain. ER thought it was a muscle spasm. Pt did move a door. Ibuprofen, Asprin, aleve, no relief. Couldn't turn neck, burning and numb in arm.Nnaproxen prescribed seem to work good for a few day. No burning now just pain from elbow to shoulder to upper back.   Pt states when she moves her arm it is week   Naproxen helped some   Not helping as much now   Patient states her left arm is getting progressively weak  Pt ws taking down a heavy door and it got hung  It was a folding door  And pulled it hard  Felt a giving away pain in the shouder and neck  Was hurting very bad so went to uricare and then the e r  Pain  Back of shoulder and upper arm  Review of Systems  Musculoskeletal: Positive for back pain.       Objective:   Physical Exam Alert and oriented, vitals reviewed and stable, NAD ENT-TM's and ext canals WNL bilat via otoscopic exam Soft palate, tonsils and post pharynx WNL via oropharyngeal exam Neck-symmetric, no masses; thyroid nonpalpable and nontender Pulmonary-no tachypnea or accessory muscle use; Clear without wheezes via auscultation Card--no abnrml murmurs, rhythm reg and rate WNL Carotid pulses symmetric, without bruits Left shoulder no obvious point tenderness.  No deformity.  No true impingement sign.  Some suprascapular tenderness some paracervical tenderness.  Patient has diminished grip and diminished strength on extension of the arm. Question diminished reflexes at left elbow     Assessment & Plan:  Impression neck and  shoulder pain with progressive arm weakness.  Shoulder exam is unremarkable.  Patient has been advised she needs an MRI by the folks with urgent care.  Will give prescription strength anti-inflammatories.  This either represents inflammation of muscles and ligaments with secondary nerve irritation or true nerve root compression.  With patient's progressive weakness feel MRI is warranted.  Rationale discussed

## 2018-11-06 ENCOUNTER — Telehealth: Payer: Self-pay | Admitting: Family Medicine

## 2018-11-06 ENCOUNTER — Other Ambulatory Visit: Payer: Self-pay | Admitting: Family Medicine

## 2018-11-06 MED ORDER — HYDROCODONE-ACETAMINOPHEN 5-325 MG PO TABS: 1.0000 | ORAL_TABLET | Freq: Four times a day (QID) | ORAL | 0 refills | Status: AC | PRN

## 2018-11-06 MED ORDER — NAPROXEN 500 MG PO TABS: 500.0000 mg | ORAL_TABLET | Freq: Two times a day (BID) | ORAL | 0 refills | Status: DC

## 2018-11-06 NOTE — Telephone Encounter (Signed)
Hydrocodone Short prescription sent in May take 1 every 6-8 hours as needed for pain caution drowsiness Home use only This is not meant as a sleeping pill It could be used in the evening when necessary to help with pain but once again it is not a bedtime pill Also I do not recommend taking muscle relaxers at all when on pain medicine Muscle relaxers have very little benefit for this type of issue

## 2018-11-06 NOTE — Telephone Encounter (Signed)
Naproxen was sent in as requested I do not see mention of hydrocodone with Dr. Soyla Dryer documentation Please find out from the patient a few more details If I do send in hydrocodone it is a limited amount and for home use only Then the patient can connect with Dr. Brett Canales next week if she needs ongoing pain medication

## 2018-11-06 NOTE — Telephone Encounter (Signed)
Patient was seen yesterday by Dr Brett Canales for neck pain- A MRI cervical spine was ordered but there was no orders for hydrocodone or naproxen.  Please advise

## 2018-11-06 NOTE — Telephone Encounter (Signed)
Patient notified and verbalized understanding. 

## 2018-11-06 NOTE — Telephone Encounter (Signed)
Patient states he recommended she takes one at night to help sleep at night until MRI  Patient uses [facility] on [address]

## 2018-11-06 NOTE — Telephone Encounter (Signed)
Pt is checking on her naproxen (NAPROSYN) 500 MG tablet and hydrocodone being called in to [id number] - [place], [place] - [address]   She was seen yesterday and told the medication would be sent in.

## 2018-11-16 ENCOUNTER — Ambulatory Visit (HOSPITAL_COMMUNITY)
Admission: RE | Admit: 2018-11-16 | Discharge: 2018-11-16 | Disposition: A | Discharge status: Home / Self Care | Payer: Managed Care, Other (non HMO) | Source: Ambulatory Visit | Attending: Family Medicine | Admitting: Family Medicine

## 2018-11-16 DIAGNOSIS — M542 Cervicalgia: Secondary | ICD-10-CM | POA: Insufficient documentation

## 2018-11-17 ENCOUNTER — Other Ambulatory Visit: Payer: Self-pay | Admitting: Family Medicine

## 2018-11-17 DIAGNOSIS — R29898 Other symptoms and signs involving the musculoskeletal system: Secondary | ICD-10-CM

## 2018-11-17 DIAGNOSIS — M542 Cervicalgia: Secondary | ICD-10-CM

## 2018-11-19 ENCOUNTER — Encounter: Payer: Self-pay | Admitting: Family Medicine

## 2018-12-21 ENCOUNTER — Encounter: Payer: Managed Care, Other (non HMO) | Admitting: Family Medicine

## 2019-10-13 ENCOUNTER — Encounter: Payer: Self-pay | Admitting: Family Medicine

## 2019-11-18 ENCOUNTER — Other Ambulatory Visit: Payer: Self-pay | Admitting: Obstetrics and Gynecology

## 2019-11-18 DIAGNOSIS — N6310 Unspecified lump in the right breast, unspecified quadrant: Secondary | ICD-10-CM

## 2019-12-02 ENCOUNTER — Other Ambulatory Visit: Payer: Self-pay

## 2019-12-02 ENCOUNTER — Ambulatory Visit
Admission: RE | Admit: 2019-12-02 | Discharge: 2019-12-02 | Disposition: A | Discharge status: Home / Self Care | Payer: Managed Care, Other (non HMO) | Source: Ambulatory Visit | Attending: Obstetrics and Gynecology | Admitting: Obstetrics and Gynecology

## 2019-12-02 DIAGNOSIS — N6310 Unspecified lump in the right breast, unspecified quadrant: Secondary | ICD-10-CM

## 2020-01-18 ENCOUNTER — Other Ambulatory Visit: Payer: Self-pay

## 2020-01-18 ENCOUNTER — Ambulatory Visit (INDEPENDENT_AMBULATORY_CARE_PROVIDER_SITE_OTHER): Payer: Managed Care, Other (non HMO) | Admitting: Family Medicine

## 2020-01-18 ENCOUNTER — Encounter: Payer: Self-pay | Admitting: Family Medicine

## 2020-01-18 VITALS — BP 126/84 | HR 72 | Temp 97.9°F | Ht 62.5 in | Wt 243.0 lb

## 2020-01-18 DIAGNOSIS — Z0000 Encounter for general adult medical examination without abnormal findings: Secondary | ICD-10-CM | POA: Diagnosis not present

## 2020-01-18 DIAGNOSIS — M25471 Effusion, right ankle: Secondary | ICD-10-CM | POA: Diagnosis not present

## 2020-01-18 DIAGNOSIS — Z1211 Encounter for screening for malignant neoplasm of colon: Secondary | ICD-10-CM

## 2020-01-18 NOTE — Progress Notes (Addendum)
Patient ID: Deanna Whitaker, female    DOB: 1965-10-27, 54 y.o.   MRN: [id number]   Chief Complaint  Patient presents with  . Annual Exam   Subjective:    HPI The patient comes in today for a wellness visit.  Has an automatic cuff- and was having high reading 176 and 200 cuff at home. Today in office normal bp. occ feel some fluttering.  H/o tachycardia.  And was given metoprolol 65m xr in past for elevated BP/tachycardia.  Went to cards and told she didn't need to take a bp med.  Never took the metoprolol.   A review of their health history was completed.  A review of medications was also completed.  Any needed refills; not taking any meds  Eating habits: sometimes health conscious  Falls/  MVA accidents in past few months: none  Regular exercise: none  Specialist pt sees on regular basis: none Preventative health issues were discussed.   Additional concerns: elevated bp and swelling in ankles. Wants to make sure she does not have a blood clot in right leg.   Mother with history of blot clot and went lung and passed away. Has ankle swelling on rt ankle, h/o surgery and swelling due to that.    Medical History WNashiahas a past medical history of Anxiety and Depression.   Outpatient Encounter Medications as of 01/18/2020  Medication Sig  . [DISCONTINUED] cyclobenzaprine (FLEXERIL) 10 MG tablet Take 1 tablet (10 mg total) by mouth 2 (two) times daily as needed for muscle spasms. (Patient not taking: Reported on 11/05/2018)  . [DISCONTINUED] metoprolol succinate (TOPROL XL) 25 MG 24 hr tablet Take 1 tablet (25 mg total) by mouth daily. (Patient not taking: Reported on 12/15/2017)  . [DISCONTINUED] naproxen (NAPROSYN) 500 MG tablet Take 1 tablet (500 mg total) by mouth 2 (two) times daily.   No facility-administered encounter medications on file as of 01/18/2020.     Review of Systems  Constitutional: Negative for chills and fever.  HENT: Negative for congestion,  rhinorrhea and sore throat.   Respiratory: Negative for cough, shortness of breath and wheezing.   Cardiovascular: Negative for chest pain and leg swelling.  Gastrointestinal: Negative for abdominal pain, diarrhea, nausea and vomiting.  Genitourinary: Negative for dysuria and frequency.  Musculoskeletal: Positive for joint swelling (rt ankle -chronic). Negative for arthralgias and back pain.  Skin: Negative for rash.  Neurological: Negative for dizziness, weakness, numbness and headaches.     Vitals BP 126/84   Pulse 72   Temp 97.9 F (36.6 C)   Ht 5' 2.5" (1.588 m)   Wt 243 lb (110.2 kg)   SpO2 97%   BMI 43.74 kg/m   Objective:   Physical Exam Vitals and nursing note reviewed.  Constitutional:      General: She is not in acute distress.    Appearance: Normal appearance. She is not ill-appearing.  HENT:     Head: Normocephalic and atraumatic.     Nose: Nose normal.     Mouth/Throat:     Mouth: Mucous membranes are moist.     Pharynx: Oropharynx is clear.  Eyes:     Extraocular Movements: Extraocular movements intact.     Conjunctiva/sclera: Conjunctivae normal.     Pupils: Pupils are equal, round, and reactive to light.  Cardiovascular:     Rate and Rhythm: Normal rate and regular rhythm.     Pulses: Normal pulses.     Heart sounds: Normal heart sounds.  Pulmonary:  Effort: Pulmonary effort is normal.     Breath sounds: Normal breath sounds. No wheezing, rhonchi or rales.  Abdominal:     General: Bowel sounds are normal. There is no distension.     Palpations: Abdomen is soft. There is no mass.     Tenderness: There is no abdominal tenderness. There is no guarding or rebound.     Hernia: No hernia is present.  Musculoskeletal:        General: Swelling (right ankle, chronic ) present. No tenderness. Normal range of motion.     Right lower leg: Edema present.     Left lower leg: Edema present.     Comments: +chronic rt ankle swelling, no erythema, warmth. No  pitting edema. Bilateral ankle swelling, right greater than left.   Skin:    General: Skin is warm and dry.     Findings: No erythema, lesion or rash.  Neurological:     General: No focal deficit present.     Mental Status: She is alert and oriented to person, place, and time.     Cranial Nerves: No cranial nerve deficit.     Sensory: No sensory deficit.  Psychiatric:        Mood and Affect: Mood normal.        Behavior: Behavior normal.      Assessment and Plan   1. Routine general medical examination at a health care facility  2. Laboratory tests ordered as part of a complete physical exam (CPE) - CBC - CMP14+EGFR - Lipid panel - Hemoglobin A1c - TSH  3. Encounter for screening colonoscopy - Ambulatory referral to Gastroenterology  4. Swelling of ankle, right   Seeing gyn for well woman exam due again on 1/22.  Elevated BP at home--advising to bring her cuff to office to compare numbers since her BP in office is normal range.  Anytime in the next 2 weeks.  Has chronic ankle swelling on right due to surgery on ankle years ago.   Advising compression stockings and elevation prn. No pitting edema, erythema or warmth.  No calf tenderness.   HM- pt declining tdap today, wanting to discuss on next visit.  Colonoscopy consult ordered.  F/u 1 yr or prn. Patient was discussed and seen in conjunction with Dr. Taylor/clinical approach to issues discussed with Dr. Crosby Oyster lab results/Steve Wolfgang Phoenix MD

## 2020-01-19 ENCOUNTER — Encounter: Payer: Self-pay | Admitting: Family Medicine

## 2020-01-20 ENCOUNTER — Encounter: Payer: Self-pay | Admitting: Family Medicine

## 2020-01-20 ENCOUNTER — Telehealth: Payer: Self-pay | Admitting: *Deleted

## 2020-01-20 DIAGNOSIS — D72829 Elevated white blood cell count, unspecified: Secondary | ICD-10-CM | POA: Insufficient documentation

## 2020-01-20 LAB — CMP14+EGFR
ALT: 20 IU/L (ref 0–32)
AST: 18 IU/L (ref 0–40)
Albumin/Globulin Ratio: 1.6 (ref 1.2–2.2)
Albumin: 4.2 g/dL (ref 3.8–4.9)
Alkaline Phosphatase: 83 IU/L (ref 39–117)
BUN/Creatinine Ratio: 7 — ABNORMAL LOW (ref 9–23)
BUN: 7 mg/dL (ref 6–24)
Bilirubin Total: 0.3 mg/dL (ref 0.0–1.2)
CO2: 27 mmol/L (ref 20–29)
Calcium: 9.6 mg/dL (ref 8.7–10.2)
Chloride: 100 mmol/L (ref 96–106)
Creatinine, Ser: 0.94 mg/dL (ref 0.57–1.00)
GFR calc Af Amer: 80 mL/min/{1.73_m2} (ref >59)
GFR calc non Af Amer: 69 mL/min/{1.73_m2} (ref >59)
Globulin, Total: 2.7 g/dL (ref 1.5–4.5)
Glucose: 124 mg/dL — ABNORMAL HIGH (ref 65–99)
Potassium: 3.8 mmol/L (ref 3.5–5.2)
Sodium: 139 mmol/L (ref 134–144)
Total Protein: 6.9 g/dL (ref 6.0–8.5)

## 2020-01-20 LAB — CBC
Hematocrit: 44.4 % (ref 34.0–46.6)
Hemoglobin: 15.7 g/dL (ref 11.1–15.9)
MCH: 31.8 pg (ref 26.6–33.0)
MCHC: 35.4 g/dL (ref 31.5–35.7)
MCV: 90 fL (ref 79–97)
Platelets: 268 10*3/uL (ref 150–450)
RBC: 4.94 x10E6/uL (ref 3.77–5.28)
RDW: 12.7 % (ref 11.7–15.4)
WBC: 12.1 10*3/uL — ABNORMAL HIGH (ref 3.4–10.8)

## 2020-01-20 LAB — HEMOGLOBIN A1C
Est. average glucose Bld gHb Est-mCnc: 137 mg/dL
Hgb A1c MFr Bld: 6.4 % — ABNORMAL HIGH (ref 4.8–5.6)

## 2020-01-20 LAB — LIPID PANEL
Chol/HDL Ratio: 2.9 ratio (ref 0.0–4.4)
Cholesterol, Total: 161 mg/dL (ref 100–199)
HDL: 56 mg/dL (ref >39)
LDL Chol Calc (NIH): 81 mg/dL (ref 0–99)
Triglycerides: 136 mg/dL (ref 0–149)
VLDL Cholesterol Cal: 24 mg/dL (ref 5–40)

## 2020-01-20 LAB — TSH: TSH: 0.943 u[IU]/mL (ref 0.450–4.500)

## 2020-01-20 NOTE — Telephone Encounter (Signed)
Discussed with patient, she states she has not had any recent infections, night sweats or unexplained fatigue. She is ok with monitoring.

## 2020-01-20 NOTE — Telephone Encounter (Signed)
Nothing overly concerning with WBC at 12.  Some people have benign finding of elevated WBC.   Is she having fatigue, night sweats, unexplained fevers, recent infections?  If not, I would recommend continuing to monitor it and if is still elevated on next visit could refer to hematologist for further evaluation.   Thx,   Dr. Ladona Ridgel

## 2020-03-31 ENCOUNTER — Encounter: Payer: Self-pay | Admitting: Family Medicine

## 2020-08-08 ENCOUNTER — Ambulatory Visit
Admission: EM | Admit: 2020-08-08 | Discharge: 2020-08-08 | Disposition: A | Discharge status: Home / Self Care | Payer: Managed Care, Other (non HMO)

## 2020-08-08 DIAGNOSIS — M779 Enthesopathy, unspecified: Secondary | ICD-10-CM | POA: Diagnosis not present

## 2020-08-08 NOTE — ED Triage Notes (Signed)
Pt c/o rt arm pain for over a week. States pain starts in elbow and radiating down to forearm or radiates up. Denies injury. States uses a keyboard most of the day for work.

## 2020-08-08 NOTE — ED Provider Notes (Signed)
[name]    CSN: [id number] Arrival date & time: 08/08/20  1747      History   Chief Complaint Chief Complaint  Patient presents with  . Arm Pain    HPI Deanna Whitaker is a 54 y.o. female  Sending for right forearm pain x1 week.  States it has been occurring most days, radiates down to her fingers.  No numbness, deformity.  Has taken naproxen with relief.  Denies injury, trauma, though is right-hand dominant and uses keyboard and mouse for most of the day at work.  Past Medical History:  Diagnosis Date  . Anxiety   . Depression     Patient Active Problem List   Diagnosis Date Noted  . Leukocytosis 01/20/2020  . Prediabetes 12/15/2017  . Essential hypertension 12/15/2017  . Ankle fracture 02/18/2015  . Bimalleolar fracture of right ankle 02/17/2015  . Right bundle branch block 07/26/2014  . Depression 07/17/2014  . Insomnia 07/17/2014  . Impaired fasting glucose 02/07/2014  . Arthritis of left knee 02/07/2014  . Obesity, unspecified 02/07/2014  . Family history of breast cancer 02/07/2014    Past Surgical History:  Procedure Laterality Date  . ABDOMINAL HYSTERECTOMY    . BREAST BIOPSY Right   . ORIF ANKLE FRACTURE Right 02/17/2015   Procedure: OPEN REDUCTION INTERNAL FIXATION (ORIF) RIGHT ANKLE FRACTURE;  Surgeon: Eldred Manges, MD;  Location: [facility] OR;  Service: Orthopedics;  Laterality: Right;    OB History   No obstetric history on file.      Home Medications    Prior to Admission medications   Medication Sig Start Date End Date Taking? Authorizing Provider  naproxen (NAPROSYN) 500 MG tablet Take 500 mg by mouth 2 (two) times daily with a meal.   Yes [provider]    Family History Family History  Problem Relation Age of Onset  . Heart disease Mother   . Colon cancer Father   . Breast cancer Sister     Social History Social History   Tobacco Use  . Smoking status: Never Smoker  . Smokeless tobacco: Never Used   Substance Use Topics  . Alcohol use: Yes    Alcohol/week: 0.0 standard drinks    Comment: 1 glass per month-wine  . Drug use: No     Allergies   Patient has no known allergies.   Review of Systems Review of Systems  Constitutional: Negative for fatigue and fever.  HENT: Negative for ear pain, sinus pain, sore throat and voice change.   Eyes: Negative for pain, redness and visual disturbance.  Respiratory: Negative for cough and shortness of breath.   Cardiovascular: Negative for chest pain and palpitations.  Gastrointestinal: Negative for abdominal pain, diarrhea and vomiting.  Musculoskeletal: Negative for arthralgias and myalgias.       R arm pain  Skin: Negative for rash and wound.  Neurological: Negative for syncope and headaches.     Physical Exam Triage Vital Signs ED Triage Vitals [08/08/20 1919]  Enc Vitals Group     BP (!) 169/101     Pulse Rate 62     Resp 18     Temp 98.5 F (36.9 C)     Temp Source Oral     SpO2 96 %     Weight      Height      Head Circumference      Peak Flow      Pain Score 6     Pain Loc  Pain Edu?      Excl. in GC?    No data found.  Updated Vital Signs BP (!) 169/101 (BP Location: Left Arm)   Pulse 62   Temp 98.5 F (36.9 C) (Oral)   Resp 18   SpO2 96%   Visual Acuity Right Eye Distance:   Left Eye Distance:   Bilateral Distance:    Right Eye Near:   Left Eye Near:    Bilateral Near:     Physical Exam Constitutional:      General: She is not in acute distress. HENT:     Head: Normocephalic and atraumatic.  Eyes:     General: No scleral icterus.    Pupils: Pupils are equal, round, and reactive to light.  Cardiovascular:     Rate and Rhythm: Normal rate.  Pulmonary:     Effort: Pulmonary effort is normal.  Musculoskeletal:        General: Tenderness present. No swelling. Normal range of motion.     Comments: Right brachial radialis tenderness without crepitus, mass, lymphadenopathy.  Neurovascularly  intact  Skin:    Coloration: Skin is not jaundiced or pale.  Neurological:     Mental Status: She is alert and oriented to person, place, and time.      UC Treatments / Results  Labs (all labs ordered are listed, but only abnormal results are displayed) Labs Reviewed - No data to display  EKG   Radiology No results found.  Procedures Procedures (including critical care time)  Medications Ordered in UC Medications - No data to display  Initial Impression / Assessment and Plan / UC Course  I have reviewed the triage vital signs and the nursing notes.  Pertinent labs & imaging results that were available during my care of the patient were reviewed by me and considered in my medical decision making (see chart for details).     H&P concerning for tendinitis of right brachioradialis.  Patient declined Ace wrap as she has been at home.  Will practice RICE as below, follow-up with Ortho as needed.  Return precautions discussed, pt verbalized understanding and is agreeable to plan. Final Clinical Impressions(s) / UC Diagnoses   Final diagnoses:  Tendonitis     Discharge Instructions     Heat therapy (hot compress, warm wash rag, hot showers, etc.) can help relax muscles and soothe muscle aches. Cold therapy (ice packs) can be used to help swelling both after injury and after prolonged use of areas of chronic pain/aches.  Pain medication:  500 mg Naprosyn/Aleve (naproxen) every 12 hours with food:  AVOID other NSAIDs while taking this (may have Tylenol).  Important to follow up with specialist(s) below for further evaluation/management if your symptoms persist or worsen.    ED Prescriptions    None     PDMP not reviewed this encounter.   Hall-Potvin, [place], [place] 08/08/20 2023

## 2020-08-08 NOTE — Discharge Instructions (Signed)
Heat therapy (hot compress, warm wash rag, hot showers, etc.) can help relax muscles and soothe muscle aches. Cold therapy (ice packs) can be used to help swelling both after injury and after prolonged use of areas of chronic pain/aches.  Pain medication:  500 mg Naprosyn/Aleve (naproxen) every 12 hours with food:  AVOID other NSAIDs while taking this (may have Tylenol).  Important to follow up with specialist(s) below for further evaluation/management if your symptoms persist or worsen. 

## 2020-09-29 ENCOUNTER — Ambulatory Visit (HOSPITAL_COMMUNITY)
Admission: EM | Admit: 2020-09-29 | Discharge: 2020-09-29 | Disposition: A | Discharge status: Home / Self Care | Payer: Managed Care, Other (non HMO) | Attending: Urgent Care | Admitting: Urgent Care

## 2020-09-29 ENCOUNTER — Encounter (HOSPITAL_COMMUNITY): Payer: Self-pay

## 2020-09-29 ENCOUNTER — Ambulatory Visit (INDEPENDENT_AMBULATORY_CARE_PROVIDER_SITE_OTHER): Payer: Managed Care, Other (non HMO)

## 2020-09-29 ENCOUNTER — Other Ambulatory Visit: Payer: Self-pay

## 2020-09-29 DIAGNOSIS — B349 Viral infection, unspecified: Secondary | ICD-10-CM

## 2020-09-29 DIAGNOSIS — Z20822 Contact with and (suspected) exposure to covid-19: Secondary | ICD-10-CM | POA: Insufficient documentation

## 2020-09-29 DIAGNOSIS — R0989 Other specified symptoms and signs involving the circulatory and respiratory systems: Secondary | ICD-10-CM | POA: Insufficient documentation

## 2020-09-29 DIAGNOSIS — R059 Cough, unspecified: Secondary | ICD-10-CM | POA: Insufficient documentation

## 2020-09-29 DIAGNOSIS — R509 Fever, unspecified: Secondary | ICD-10-CM | POA: Diagnosis not present

## 2020-09-29 LAB — SARS CORONAVIRUS 2 (TAT 6-24 HRS): SARS Coronavirus 2: NEGATIVE

## 2020-09-29 MED ORDER — PREDNISONE 20 MG PO TABS
ORAL_TABLET | ORAL | 0 refills | Status: DC
Start: 2020-09-29 — End: 2021-04-18

## 2020-09-29 MED ORDER — PROMETHAZINE-DM 6.25-15 MG/5ML PO SYRP
5.0000 mL | ORAL_SOLUTION | Freq: Every evening | ORAL | 0 refills | Status: DC | PRN
Start: 2020-09-29 — End: 2021-04-18

## 2020-09-29 MED ORDER — CETIRIZINE HCL 10 MG PO TABS: 10.0000 mg | ORAL_TABLET | Freq: Every day | ORAL | 0 refills | Status: DC

## 2020-09-29 MED ORDER — BENZONATATE 100 MG PO CAPS: 100.0000 mg | ORAL_CAPSULE | Freq: Three times a day (TID) | ORAL | 0 refills | Status: DC | PRN

## 2020-09-29 NOTE — ED Triage Notes (Signed)
Pt states she received booster vaccine on 01/06 and reports general malaise symptoms that resolved.  Pt c/o onset Saturday of productive cough with white sputum, with reported small amount of blood in sputum upon waking. Pt reports general malaise, sneezing but states symptoms are improving but concerned for 'wheezing".  Denies SOB, CP, fever, n/v/d.   Able to speak full long sentences w/o complication. Exp wheezes auscultated bilateral upper lobes  Took home COVID test on Tuesday and it was negative.  States her son had several rapid/home COVID tests as negative, but had positive PCR in 2nd week of Jan

## 2020-09-29 NOTE — ED Provider Notes (Signed)
[facility]   MRN: [id number] DOB: 1966/05/10  Subjective:   Deanna Whitaker is a 55 y.o. female presenting for 6-day history of acute onset productive cough, started having hemoptysis in the past day. Patient is COVID vaccinated. Has had general malaise, sneezing, wheezing. Has a history of elevated blood pressure readings but was advised that she does not need blood pressure medication by her cardiologist.  She just got her booster on January 6.  She felt a little sick then but improved.  She had a COVID test at that time that was negative.  Her son actually tested positive but was told it was a false positive.  He actually had sick symptoms as well.  No current facility-administered medications for this encounter.  Current Outpatient Medications:    naproxen (NAPROSYN) 500 MG tablet, Take 500 mg by mouth 2 (two) times daily with a meal., Disp: , Rfl:    No Known Allergies  Past Medical History:  Diagnosis Date   Anxiety    Depression      Past Surgical History:  Procedure Laterality Date   ABDOMINAL HYSTERECTOMY     BREAST BIOPSY Right    ORIF ANKLE FRACTURE Right 02/17/2015   Procedure: OPEN REDUCTION INTERNAL FIXATION (ORIF) RIGHT ANKLE FRACTURE;  Surgeon: Eldred Manges, MD;  Location: [facility] OR;  Service: Orthopedics;  Laterality: Right;    Family History  Problem Relation Age of Onset   Heart disease Mother    Colon cancer Father    Breast cancer Sister     Social History   Tobacco Use   Smoking status: Never Smoker   Smokeless tobacco: Never Used  Substance Use Topics   Alcohol use: Yes    Alcohol/week: 0.0 standard drinks    Comment: 1 glass per month-wine   Drug use: No    ROS   Objective:   Vitals: BP (!) 173/76 (BP Location: Left Arm)    Pulse 70    Temp 98.2 F (36.8 C) (Oral)    Resp 18    Wt 250 lb 9.6 oz (113.7 kg)    SpO2 98%    BMI 45.10 kg/m   BP Readings from Last 3 Encounters:  09/29/20 (!) 173/76  08/08/20  (!) 169/101  01/18/20 126/84   Physical Exam Constitutional:      General: She is not in acute distress.    Appearance: Normal appearance. She is well-developed. She is not ill-appearing, toxic-appearing or diaphoretic.  HENT:     Head: Normocephalic and atraumatic.     Nose: Nose normal.     Mouth/Throat:     Mouth: Mucous membranes are moist.  Eyes:     Extraocular Movements: Extraocular movements intact.     Pupils: Pupils are equal, round, and reactive to light.  Cardiovascular:     Rate and Rhythm: Normal rate and regular rhythm.     Pulses: Normal pulses.     Heart sounds: Normal heart sounds. No murmur heard. No friction rub. No gallop.   Pulmonary:     Effort: Pulmonary effort is normal. No respiratory distress.     Breath sounds: No stridor. Wheezing and rhonchi present. No rales.  Skin:    General: Skin is warm and dry.     Findings: No rash.  Neurological:     Mental Status: She is alert and oriented to person, place, and time.  Psychiatric:        Mood and Affect: Mood normal.  Behavior: Behavior normal.        Thought Content: Thought content normal.     DG Chest 2 View  Result Date: 09/29/2020 CLINICAL DATA:  Cough, fever EXAM: CHEST - 2 VIEW COMPARISON:  02/16/2015 FINDINGS: The heart size and mediastinal contours are within normal limits. Both lungs are clear. The visualized skeletal structures are unremarkable. IMPRESSION: No active cardiopulmonary disease. Electronically Signed   By: Elige Ko   On: 09/29/2020 13:20    Assessment and Plan :   PDMP not reviewed this encounter.  1. Viral syndrome   2. Abnormal lung sounds   3. Cough     Given her lung sounds and symptoms offered an oral prednisone course.  Recommend supportive care otherwise.  COVID-19 testing is pending. Counseled patient on potential for adverse effects with medications prescribed/recommended today, ER and return-to-clinic precautions discussed, patient verbalized  understanding.    Wallis Bamberg, PA-C 09/29/20 1350

## 2021-01-01 ENCOUNTER — Other Ambulatory Visit: Payer: Self-pay | Admitting: Obstetrics and Gynecology

## 2021-01-01 DIAGNOSIS — Z1231 Encounter for screening mammogram for malignant neoplasm of breast: Secondary | ICD-10-CM

## 2021-02-01 IMAGING — DX DG CHEST 2V
2 series · 2 of 2 positions shown · non-contrast
Comparison: 02/16/2015

CLINICAL DATA: Cough, fever

EXAM:
CHEST - 2 VIEW

[chest pa]
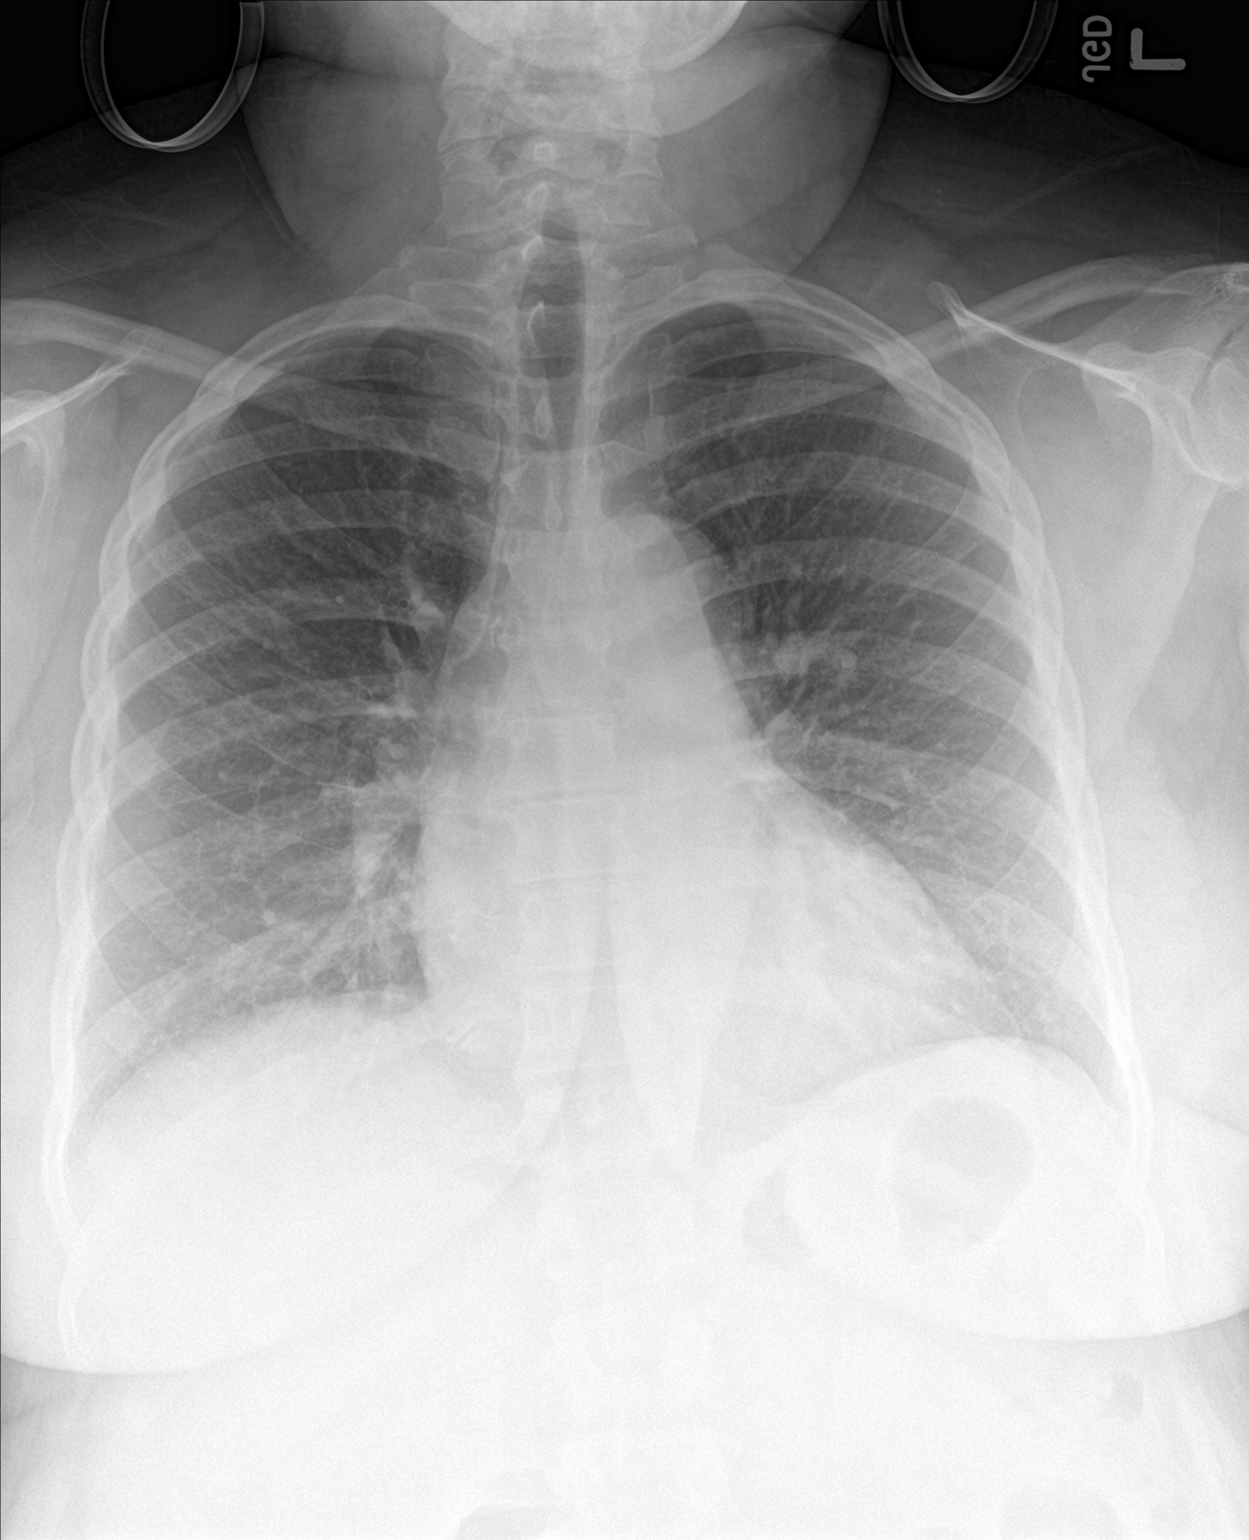

[chest lat]
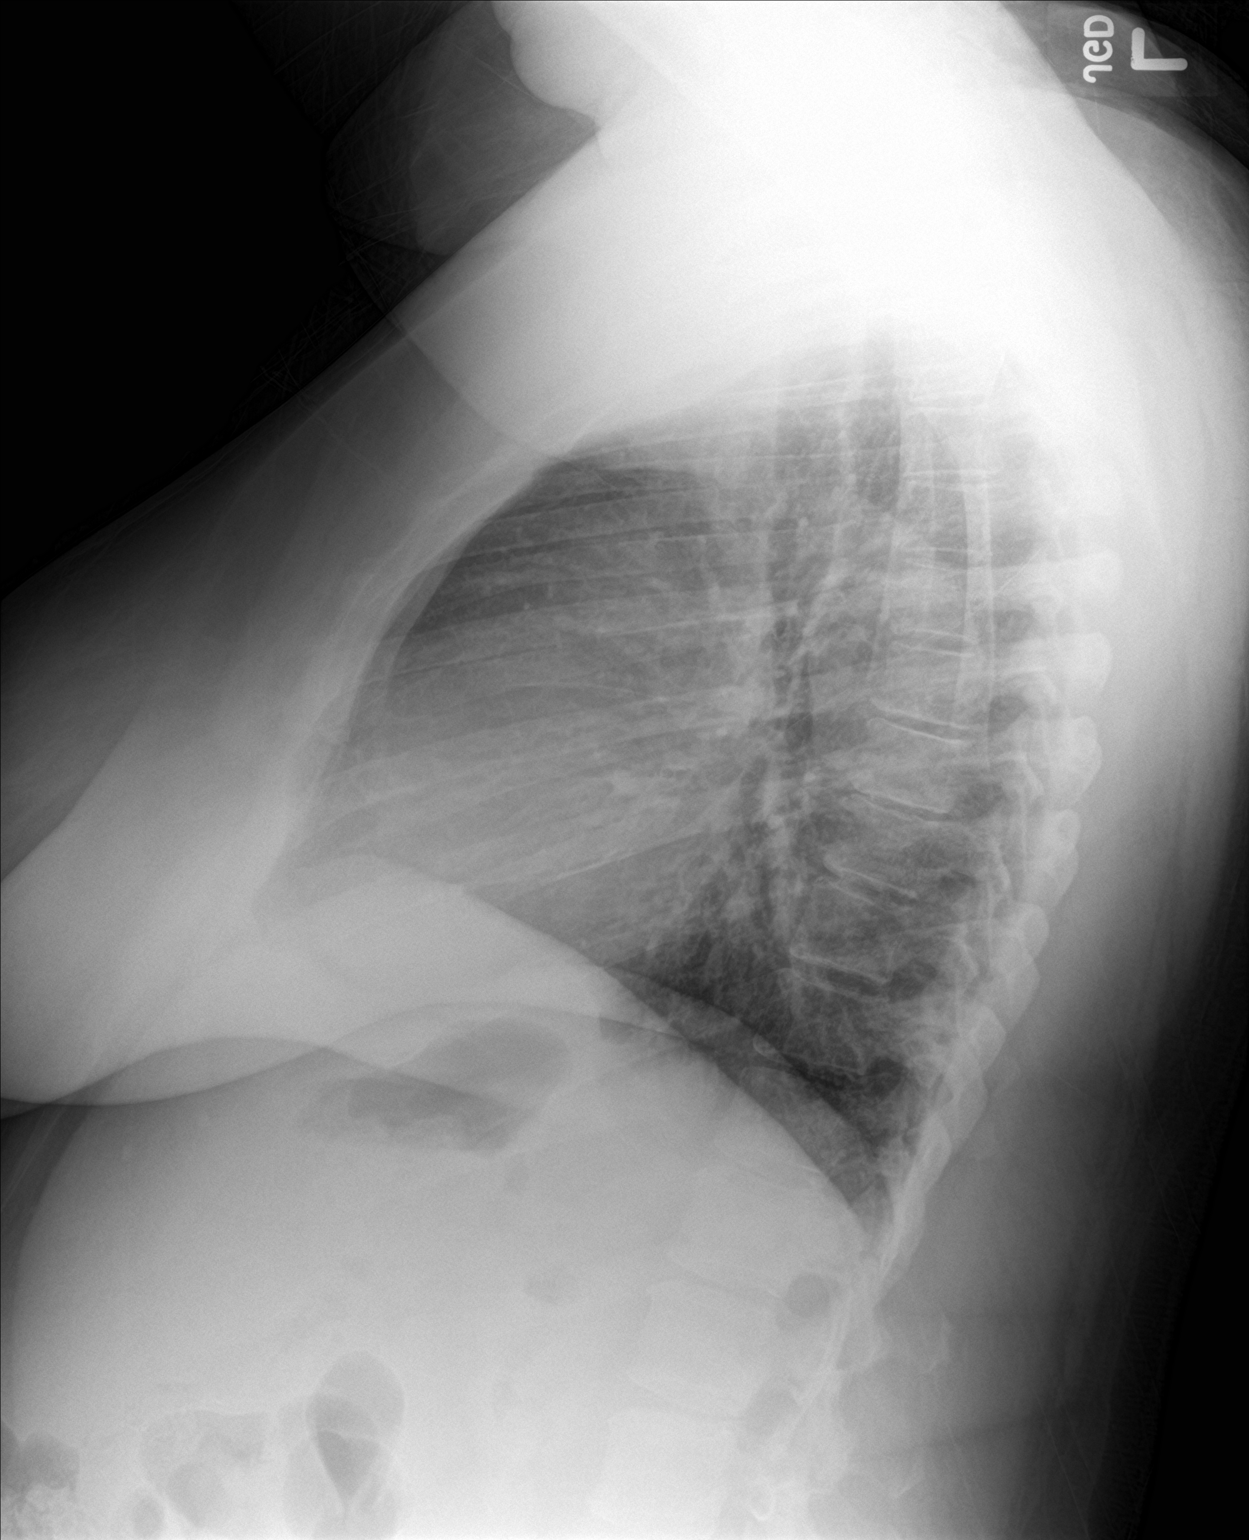

[2 of 2 positions shown; findings below may reference images not displayed]

FINDINGS: The heart size and mediastinal contours are within normal limits.
Both lungs are clear. The visualized skeletal structures are
unremarkable.
IMPRESSION: No active cardiopulmonary disease.

## 2021-02-20 ENCOUNTER — Ambulatory Visit: Payer: Managed Care, Other (non HMO)

## 2021-04-12 ENCOUNTER — Other Ambulatory Visit: Payer: Self-pay

## 2021-04-12 ENCOUNTER — Ambulatory Visit
Admission: RE | Admit: 2021-04-12 | Discharge: 2021-04-12 | Disposition: A | Discharge status: Home / Self Care | Payer: Managed Care, Other (non HMO) | Source: Ambulatory Visit | Attending: Obstetrics and Gynecology | Admitting: Obstetrics and Gynecology

## 2021-04-12 DIAGNOSIS — Z1231 Encounter for screening mammogram for malignant neoplasm of breast: Secondary | ICD-10-CM

## 2021-04-18 ENCOUNTER — Ambulatory Visit
Admission: EM | Admit: 2021-04-18 | Discharge: 2021-04-18 | Disposition: A | Discharge status: Home / Self Care | Payer: Managed Care, Other (non HMO) | Attending: Urgent Care | Admitting: Urgent Care

## 2021-04-18 ENCOUNTER — Other Ambulatory Visit: Payer: Self-pay

## 2021-04-18 DIAGNOSIS — R1030 Lower abdominal pain, unspecified: Secondary | ICD-10-CM | POA: Insufficient documentation

## 2021-04-18 DIAGNOSIS — N3000 Acute cystitis without hematuria: Secondary | ICD-10-CM | POA: Insufficient documentation

## 2021-04-18 LAB — POCT URINALYSIS DIP (MANUAL ENTRY)
Bilirubin, UA: NEGATIVE
Glucose, UA: 100 mg/dL — AB
Ketones, POC UA: NEGATIVE mg/dL
Leukocytes, UA: NEGATIVE
Nitrite, UA: NEGATIVE
Protein Ur, POC: NEGATIVE mg/dL
Spec Grav, UA: 1.02 (ref 1.010–1.025)
Urobilinogen, UA: 1 E.U./dL
pH, UA: 7 (ref 5.0–8.0)

## 2021-04-18 MED ORDER — CEPHALEXIN 500 MG PO CAPS: 500.0000 mg | ORAL_CAPSULE | Freq: Two times a day (BID) | ORAL | 0 refills | Status: AC

## 2021-04-18 MED ORDER — PHENAZOPYRIDINE HCL 200 MG PO TABS: 200.0000 mg | ORAL_TABLET | Freq: Three times a day (TID) | ORAL | 0 refills | Status: AC

## 2021-04-18 NOTE — Discharge Instructions (Addendum)
If your symptoms worsen (such as increased pain, fever), go to the ER. If your symptoms stay the same and don't improve, follow up with your primary care practice.

## 2021-04-18 NOTE — ED Triage Notes (Signed)
Pt c/o lower abdominal pain onset yesterday states used motrin at home with only temporary relief. The pain continued into today when pt tried AZO with only temporary relief which prompted her visit today. States pain is 6/10 "pressure" that is based in bilateral lower abd and sometimes radiates to lower back.

## 2021-04-18 NOTE — ED Provider Notes (Signed)
Deanna Whitaker    CSN: [id number] Arrival date & time: 04/18/21  1950      History   Chief Complaint Chief Complaint  Patient presents with   Abdominal Pain    HPI ETOILE Whitaker is a 55 y.o. female who reports lower abdominal pain and dysuria x 1 day. Began having symptoms last night not relieved by ibuprofen. Took [employer] AZO today for relief of symptoms but pt feels pain is returning despite AZO. Reports urinary frequency and urgency for small amounts of urine.    Abdominal Pain Associated symptoms: dysuria   Associated symptoms: no constipation, no diarrhea, no fever, no hematuria, no nausea, no vaginal bleeding, no vaginal discharge and no vomiting    Past Medical History:  Diagnosis Date   Anxiety    Depression     Patient Active Problem List   Diagnosis Date Noted   Leukocytosis 01/20/2020   Prediabetes 12/15/2017   Essential hypertension 12/15/2017   Ankle fracture 02/18/2015   Bimalleolar fracture of right ankle 02/17/2015   Right bundle branch block 07/26/2014   Depression 07/17/2014   Insomnia 07/17/2014   Impaired fasting glucose 02/07/2014   Arthritis of left knee 02/07/2014   Obesity, unspecified 02/07/2014   Family history of breast cancer 02/07/2014    Past Surgical History:  Procedure Laterality Date   ABDOMINAL HYSTERECTOMY     BREAST BIOPSY Right    ORIF ANKLE FRACTURE Right 02/17/2015   Procedure: OPEN REDUCTION INTERNAL FIXATION (ORIF) RIGHT ANKLE FRACTURE;  Surgeon: Eldred Manges, MD;  Location: [facility] OR;  Service: Orthopedics;  Laterality: Right;    OB History   No obstetric history on file.      Home Medications    Prior to Admission medications   Medication Sig Start Date End Date Taking? Authorizing Provider  cephALEXin (KEFLEX) 500 MG capsule Take 1 capsule (500 mg total) by mouth 2 (two) times daily for 7 days. 04/18/21 04/25/21 Yes Cathlyn Parsons, NP  phenazopyridine (PYRIDIUM) 200 MG tablet Take 1  tablet (200 mg total) by mouth 3 (three) times daily for 2 days. 04/18/21 04/20/21 Yes Cathlyn Parsons, NP    Family History Family History  Problem Relation Age of Onset   Heart disease Mother    Colon cancer Father    Breast cancer Sister     Social History Social History   Tobacco Use   Smoking status: Never   Smokeless tobacco: Never  Substance Use Topics   Alcohol use: Yes    Alcohol/week: 0.0 standard drinks    Comment: 1 glass per month-wine   Drug use: No     Allergies   Patient has no known allergies.   Review of Systems Review of Systems  Constitutional:  Negative for fever.  Gastrointestinal:  Positive for abdominal pain. Negative for blood in stool, constipation, diarrhea, nausea and vomiting.  Genitourinary:  Positive for difficulty urinating, dysuria, flank pain, frequency and urgency. Negative for hematuria, pelvic pain, vaginal bleeding, vaginal discharge and vaginal pain.  Skin:  Negative for rash.    Physical Exam Triage Vital Signs ED Triage Vitals [04/18/21 2002]  Enc Vitals Group     BP (!) 187/88     Pulse Rate 72     Resp 18     Temp 98.1 F (36.7 C)     Temp Source Oral     SpO2 96 %     Weight      Height  Head Circumference      Peak Flow      Pain Score 6     Pain Loc      Pain Edu?      Excl. in GC?    No data found.  Updated Vital Signs BP (!) 187/88 (BP Location: Left Arm)   Pulse 72   Temp 98.1 F (36.7 C) (Oral)   Resp 18   SpO2 96%   Visual Acuity Right Eye Distance:   Left Eye Distance:   Bilateral Distance:    Right Eye Near:   Left Eye Near:    Bilateral Near:     Physical Exam Constitutional:      Appearance: She is well-developed. She is not ill-appearing.     Comments: Appears uncomfortable  Cardiovascular:     Rate and Rhythm: Normal rate and regular rhythm.     Heart sounds: Normal heart sounds.  Pulmonary:     Effort: Pulmonary effort is normal.     Breath sounds: Normal breath sounds.   Abdominal:     General: Abdomen is flat. Bowel sounds are normal. There is no distension.     Tenderness: There is right CVA tenderness and left CVA tenderness. There is no guarding or rebound.     Comments: BLQ mild-mod tenderness to palpation; no suprapubic tenderness to palp  Skin:    General: Skin is warm and dry.     Findings: No rash.  Neurological:     Mental Status: She is alert.     UC Treatments / Results  Labs (all labs ordered are listed, but only abnormal results are displayed) Labs Reviewed  POCT URINALYSIS DIP (MANUAL ENTRY) - Abnormal; Notable for the following components:      Result Value   Glucose, UA =100 (*)    Blood, UA trace-intact (*)    All other components within normal limits  URINE CULTURE    EKG   Radiology No results found.  Procedures Procedures (including critical Whitaker time)  Medications Ordered in UC Medications - No data to display  Initial Impression / Assessment and Plan / UC Course  I have reviewed the triage vital signs and the nursing notes.  Pertinent labs & imaging results that were available during my Whitaker of the patient were reviewed by me and considered in my medical decision making (see chart for details).  Pt has some symptoms of UTI but UA negative. Will tx presumptively for UTI- if symptoms worsen, pt to go to ED. If symptoms do not improve, can f/u with PCP.    Final Clinical Impressions(s) / UC Diagnoses   Final diagnoses:  Lower abdominal pain  Acute cystitis without hematuria     Discharge Instructions      If your symptoms worsen (such as increased pain, fever), go to the ER. If your symptoms stay the same and don't improve, follow up with your primary Whitaker practice.    ED Prescriptions     Medication Sig Dispense Auth. Provider   phenazopyridine (PYRIDIUM) 200 MG tablet Take 1 tablet (200 mg total) by mouth 3 (three) times daily for 2 days. 6 tablet Cathlyn Parsons, NP   cephALEXin (KEFLEX) 500 MG  capsule Take 1 capsule (500 mg total) by mouth 2 (two) times daily for 7 days. 14 capsule Cathlyn Parsons, NP      PDMP not reviewed this encounter.   Cathlyn Parsons, NP 04/18/21 2031

## 2021-04-21 LAB — URINE CULTURE: Special Requests: NORMAL

## 2021-08-15 IMAGING — MG MM DIGITAL SCREENING BILAT W/ TOMO AND CAD
6 of 10 series · 6 of 30 positions shown · non-contrast
Comparison: Previous exam(s).

CLINICAL DATA: Screening.

EXAM:
DIGITAL SCREENING BILATERAL MAMMOGRAM WITH TOMOSYNTHESIS AND CAD
TECHNIQUE: Bilateral screening digital craniocaudal and mediolateral oblique
mammograms were obtained. Bilateral screening digital breast
tomosynthesis was performed. The images were evaluated with
computer-aided detection.

[R CC synth-2D (1 of 2)]
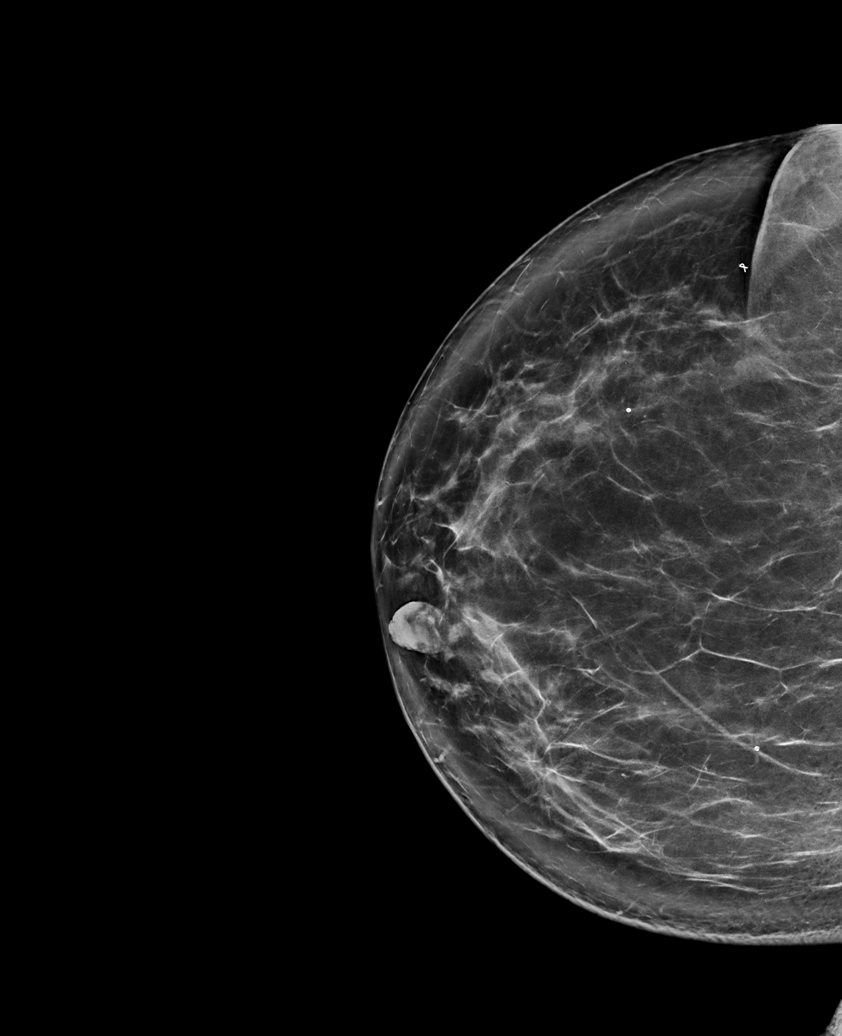

[R CC synth-2D (2 of 2)]
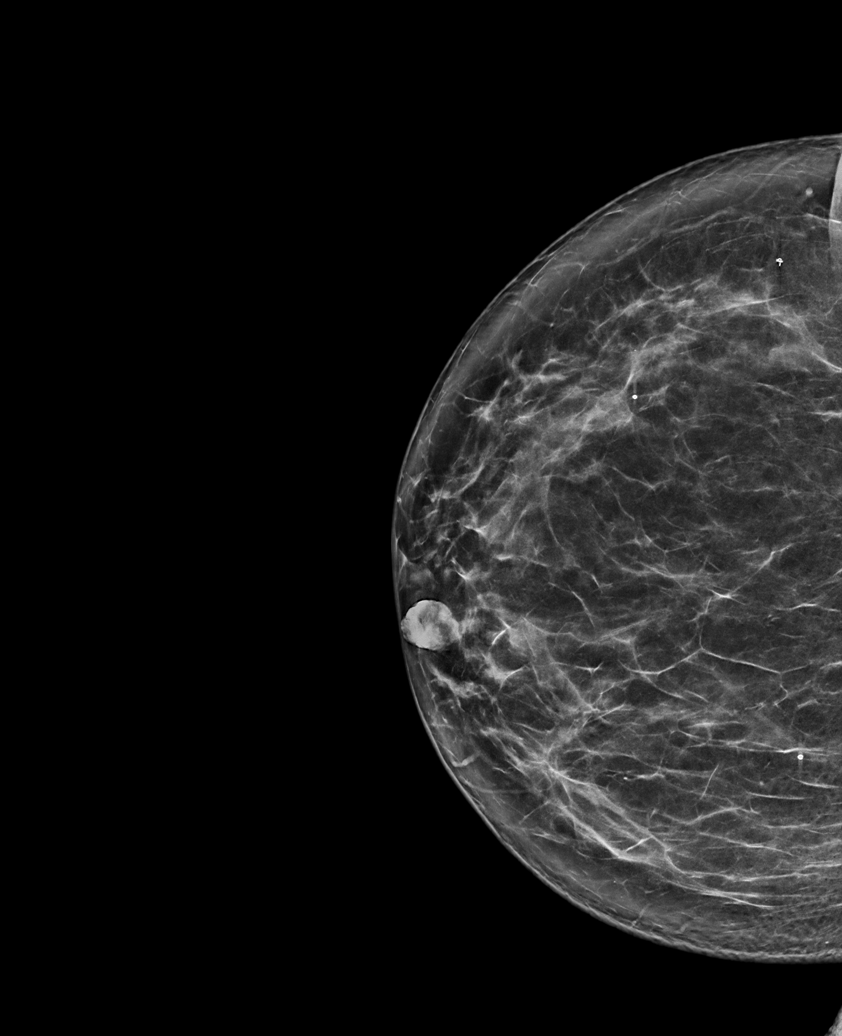

[L MLO synth-2D]
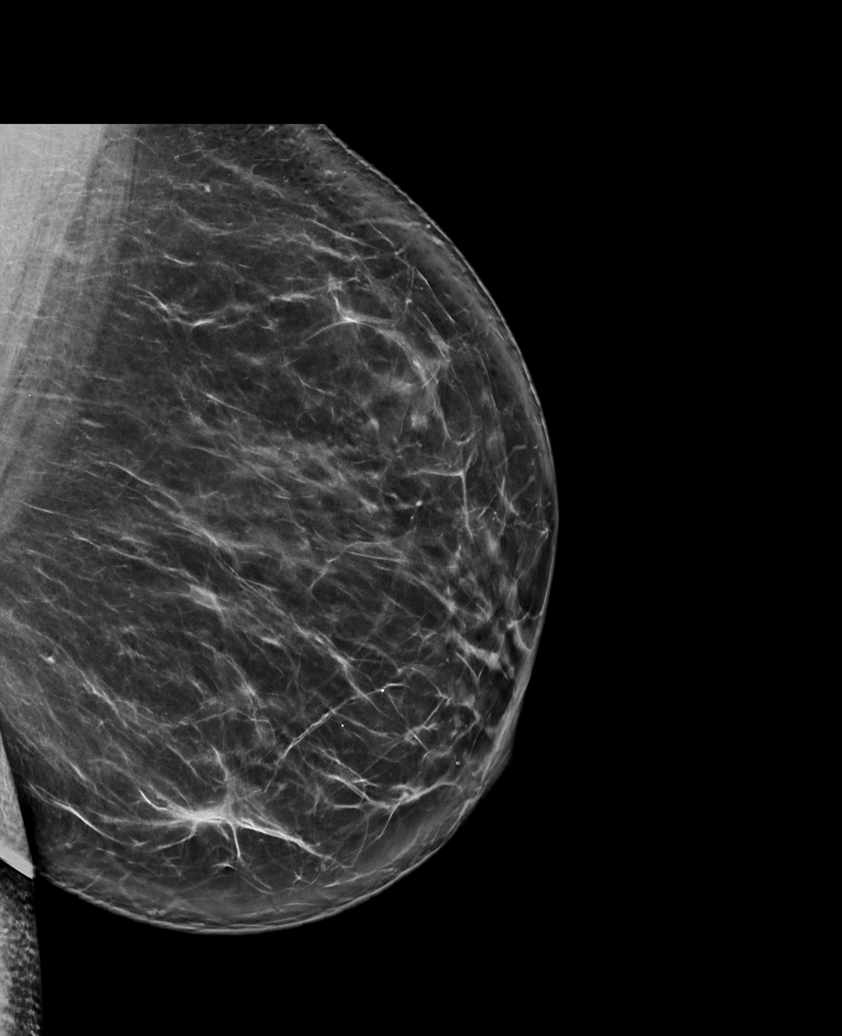

[L CC synth-2D]
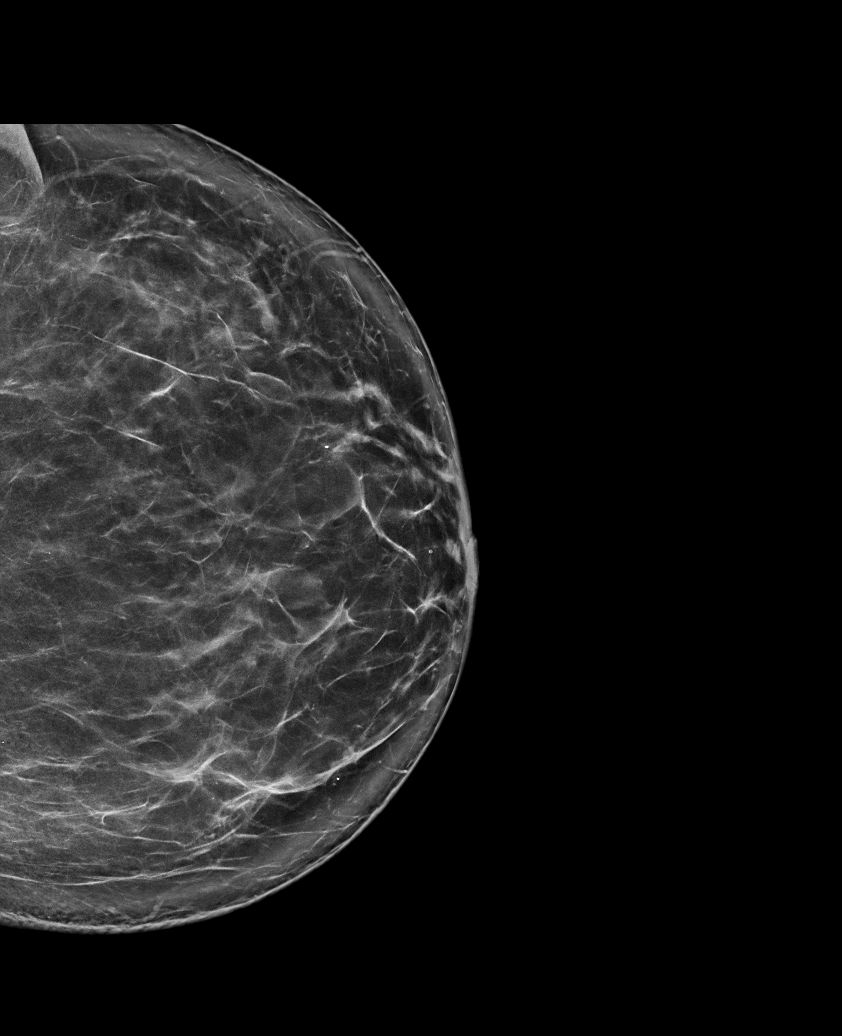

[R MLO synth-2D]
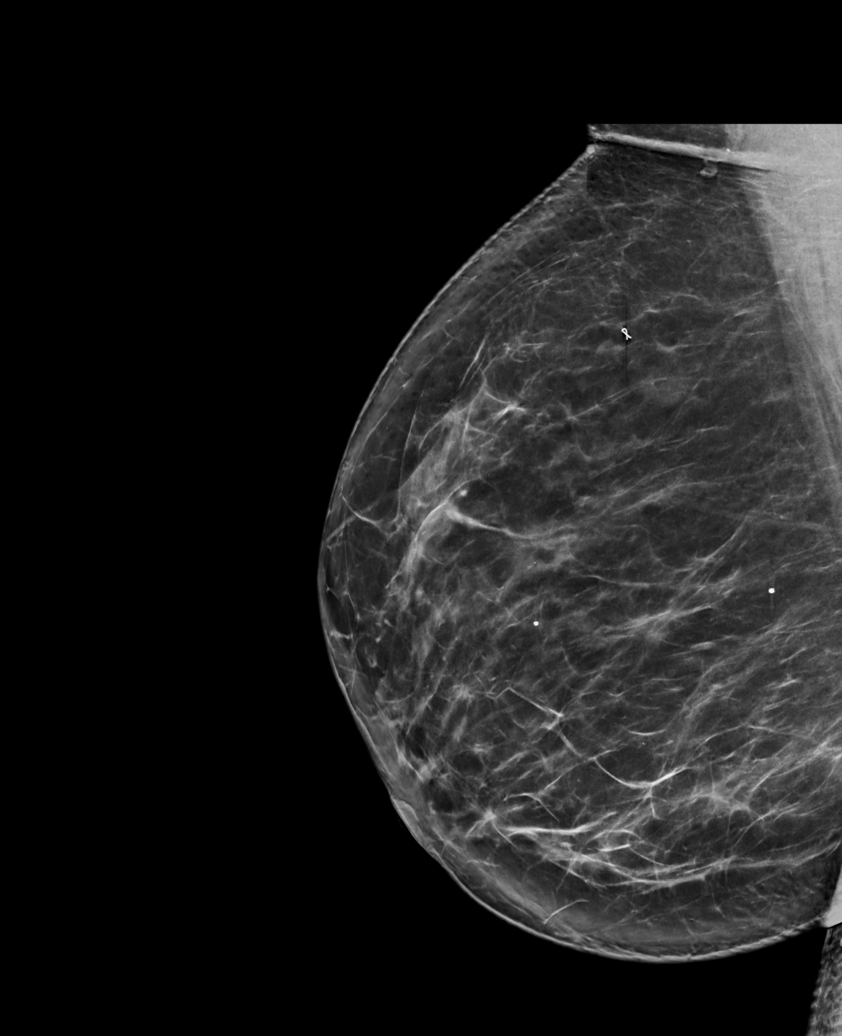

[L MLO tomo · tomo slice 49/98.0]
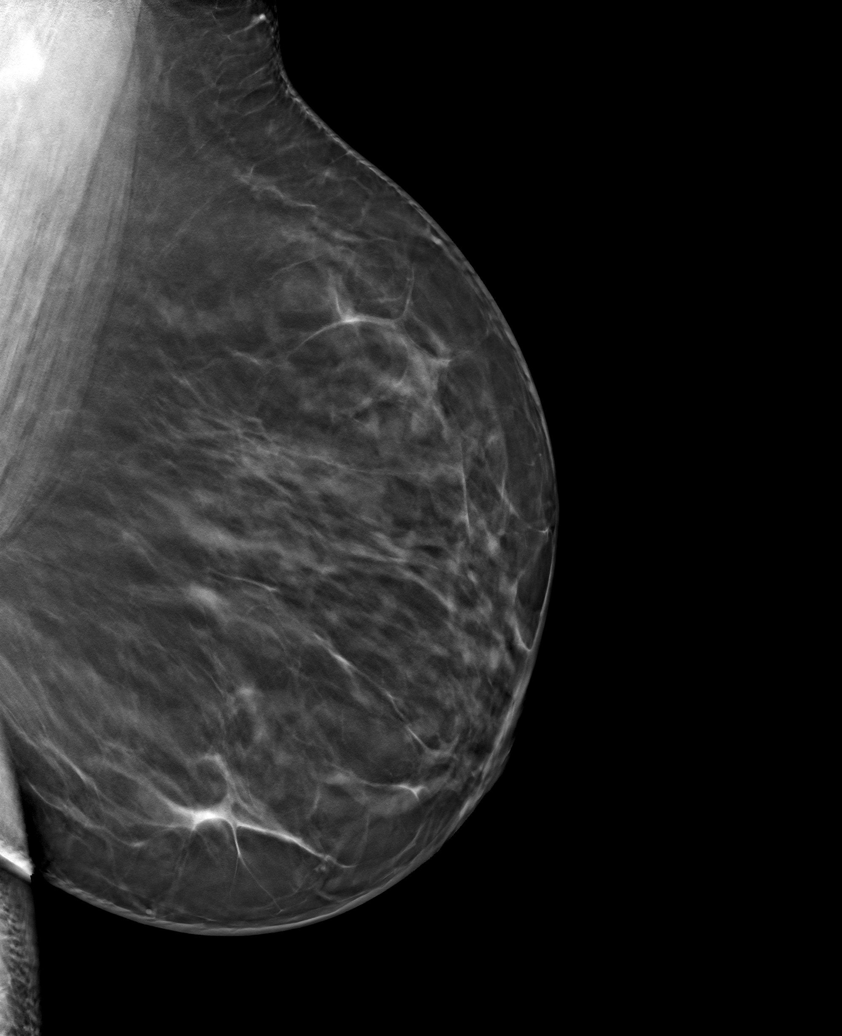

[6 of 30 positions shown; findings below may reference images not displayed]

ACR Breast Density Category b: There are scattered areas of
fibroglandular density.
FINDINGS: There are no findings suspicious for malignancy.
IMPRESSION: No mammographic evidence of malignancy. A result letter of this
screening mammogram will be mailed directly to the patient.

RECOMMENDATION:
Screening mammogram in one year. (Code:51-O-LD2)

BI-RADS CATEGORY  1: Negative.
